# Patient Record
Sex: Female | Born: 1960 | Hispanic: Yes | Smoking: Never smoker
Health system: Southern US, Community
[De-identification: ages and names within clinical notes are randomized; demographics above are authoritative.]

## PROBLEM LIST (undated history)

## (undated) DIAGNOSIS — I10 Essential (primary) hypertension: Secondary | ICD-10-CM

## (undated) DIAGNOSIS — E785 Hyperlipidemia, unspecified: Secondary | ICD-10-CM

## (undated) HISTORY — PX: APPENDECTOMY: SHX54

## (undated) HISTORY — DX: Hyperlipidemia, unspecified: E78.5

## (undated) HISTORY — DX: Essential (primary) hypertension: I10

---

## 2015-01-03 ENCOUNTER — Encounter: Payer: Self-pay | Admitting: Family Medicine

## 2015-01-03 ENCOUNTER — Ambulatory Visit (INDEPENDENT_AMBULATORY_CARE_PROVIDER_SITE_OTHER): Payer: Self-pay | Admitting: Family Medicine

## 2015-01-03 VITALS — BP 160/90 | HR 79 | Ht 59.0 in | Wt 143.0 lb

## 2015-01-03 DIAGNOSIS — Z0000 Encounter for general adult medical examination without abnormal findings: Secondary | ICD-10-CM

## 2015-01-03 DIAGNOSIS — I10 Essential (primary) hypertension: Secondary | ICD-10-CM

## 2015-01-03 DIAGNOSIS — Z1239 Encounter for other screening for malignant neoplasm of breast: Secondary | ICD-10-CM

## 2015-01-03 DIAGNOSIS — R011 Cardiac murmur, unspecified: Secondary | ICD-10-CM

## 2015-01-03 DIAGNOSIS — Z23 Encounter for immunization: Secondary | ICD-10-CM

## 2015-01-03 DIAGNOSIS — R030 Elevated blood-pressure reading, without diagnosis of hypertension: Secondary | ICD-10-CM

## 2015-01-03 DIAGNOSIS — IMO0001 Reserved for inherently not codable concepts without codable children: Secondary | ICD-10-CM | POA: Insufficient documentation

## 2015-01-03 HISTORY — DX: Essential (primary) hypertension: I10

## 2015-01-03 LAB — CBC
HEMATOCRIT: 42 % (ref 36.0–46.0)
HEMOGLOBIN: 14.1 g/dL (ref 12.0–15.0)
MCH: 29.3 pg (ref 26.0–34.0)
MCHC: 33.6 g/dL (ref 30.0–36.0)
MCV: 87.3 fL (ref 78.0–100.0)
MPV: 9.1 fL (ref 8.6–12.4)
Platelets: 374 10*3/uL (ref 150–400)
RBC: 4.81 MIL/uL (ref 3.87–5.11)
RDW: 13.1 % (ref 11.5–15.5)
WBC: 4.3 10*3/uL (ref 4.0–10.5)

## 2015-01-03 NOTE — Assessment & Plan Note (Signed)
Recheck in a few weeks.

## 2015-01-03 NOTE — Progress Notes (Signed)
Michele Burch is a 54 y.o. female who presents to [name]  today for establish care. Patient denies any current medical problems. She feels well and is asymptomatic. She is here today to establish care and to get a form filled out for her work. She notes that in the past she's had an appendectomy, and some other abdominal surgery that she's not quite sure of. Additionally 2 years ago she was found to have a murmur and [place].  She states that everything was normal and has records at home that she will bring in. No fevers chills nausea vomiting or diarrhea.   Health maintenance: Patient is unsure she's never had any kind of colon cancer screening. She states her last mammogram was around 2 years ago and has been "a long time" since her last Pap smear. She received a flu vaccine last week and is happy to get a tetanus vaccine today.  No past medical history on file. No past surgical history on file. Social History  Substance Use Topics  . Smoking status: Not on file  . Smokeless tobacco: Not on file  . Alcohol Use: Not on file   family history is not on file.  ROS as above Medications: No current outpatient prescriptions on file.   No current facility-administered medications for this visit.   Allergies not on file   Exam:  BP 160/90 mmHg  Pulse 79  Ht  (1.499 m)  Wt 143 lb (64.864 kg)  BMI 28.87 kg/m2 Gen: Well NAD HEENT: EOMI,  MMM Lungs: Normal work of breathing. CTABL Heart: Slight systolic murmur nonradiating. Best heard at the right upper sternal border no MRG Abd: NABS, Soft. Nondistended, Nontender Exts: Brisk capillary refill, warm and well perfused.   No results found for this or any previous visit (from the past 24 hour(s)). No results found.  Patient was given a Tdap vaccine prior to discharge Please see individual assessment and plan sections.

## 2015-01-03 NOTE — Assessment & Plan Note (Signed)
Patient will bring medical records for review.

## 2015-01-03 NOTE — Patient Instructions (Signed)
Thank you for coming in today. Return in 2-4 weeks for blood pressure recheck.  The mammogram will call you.  Get labs today.  We will call you with results.   Hypertension Hypertension, commonly called high blood pressure, is when the force of blood pumping through your arteries is too strong. Your arteries are the blood vessels that carry blood from your heart throughout your body. A blood pressure reading consists of a higher number over a lower number, such as 110/72. The higher number (systolic) is the pressure inside your arteries when your heart pumps. The lower number (diastolic) is the pressure inside your arteries when your heart relaxes. Ideally you want your blood pressure below 120/80. Hypertension forces your heart to work harder to pump blood. Your arteries may become narrow or stiff. Having untreated or uncontrolled hypertension can cause heart attack, stroke, kidney disease, and other problems. RISK FACTORS Some risk factors for high blood pressure are controllable. Others are not.  Risk factors you cannot control include:   Race. You may be at higher risk if you are African American.  Age. Risk increases with age.  Gender. Men are at higher risk than women before age 9 years. After age 48, women are at higher risk than men. Risk factors you can control include:  Not getting enough exercise or physical activity.  Being overweight.  Getting too much fat, sugar, calories, or salt in your diet.  Drinking too much alcohol. SIGNS AND SYMPTOMS Hypertension does not usually cause signs or symptoms. Extremely high blood pressure (hypertensive crisis) may cause headache, anxiety, shortness of breath, and nosebleed. DIAGNOSIS To check if you have hypertension, your health care provider will measure your blood pressure while you are seated, with your arm held at the level of your heart. It should be measured at least twice using the same arm. Certain conditions can cause a  difference in blood pressure between your right and left arms. A blood pressure reading that is higher than normal on one occasion does not mean that you need treatment. If it is not clear whether you have high blood pressure, you may be asked to return on a different day to have your blood pressure checked again. Or, you may be asked to monitor your blood pressure at home for 1 or more weeks. TREATMENT Treating high blood pressure includes making lifestyle changes and possibly taking medicine. Living a healthy lifestyle can help lower high blood pressure. You may need to change some of your habits. Lifestyle changes may include:  Following the DASH diet. This diet is high in fruits, vegetables, and whole grains. It is low in salt, red meat, and added sugars.  Keep your sodium intake below 2,300 mg per day.  Getting at least 30-45 minutes of aerobic exercise at least 4 times per week.  Losing weight if necessary.  Not smoking.  Limiting alcoholic beverages.  Learning ways to reduce stress. Your health care provider may prescribe medicine if lifestyle changes are not enough to get your blood pressure under control, and if one of the following is true:  You are 44-25 years of age and your systolic blood pressure is above 140.  You are 39 years of age or older, and your systolic blood pressure is above 150.  Your diastolic blood pressure is above 90.  You have diabetes, and your systolic blood pressure is over 140 or your diastolic blood pressure is over 90.  You have kidney disease and your blood pressure is above  140/90.  You have heart disease and your blood pressure is above 140/90. Your personal target blood pressure may vary depending on your medical conditions, your age, and other factors. HOME CARE INSTRUCTIONS  Have your blood pressure rechecked as directed by your health care provider.   Take medicines only as directed by your health care provider. Follow the directions  carefully. Blood pressure medicines must be taken as prescribed. The medicine does not work as well when you skip doses. Skipping doses also puts you at risk for problems.  Do not smoke.   Monitor your blood pressure at home as directed by your health care provider. SEEK MEDICAL CARE IF:   You think you are having a reaction to medicines taken.  You have recurrent headaches or feel dizzy.  You have swelling in your ankles.  You have trouble with your vision. SEEK IMMEDIATE MEDICAL CARE IF:  You develop a severe headache or confusion.  You have unusual weakness, numbness, or feel faint.  You have severe chest or abdominal pain.  You vomit repeatedly.  You have trouble breathing. MAKE SURE YOU:   Understand these instructions.  Will watch your condition.  Will get help right away if you are not doing well or get worse.   This information is not intended to replace advice given to you by your health care provider. Make sure you discuss any questions you have with your health care provider.   Document Released: 03/12/2005 Document Revised: 07/27/2014 Document Reviewed: 01/02/2013 Elsevier Interactive Patient Education [employer]

## 2015-01-03 NOTE — Assessment & Plan Note (Addendum)
Doing reasonably well. Check metabolic panel lipid panel HIV hepatitis C CBC. Tdap given today. Patient declined Pap smear and colonoscopy study that she'll think about it. Mammogram ordered

## 2015-01-04 ENCOUNTER — Telehealth: Payer: Self-pay | Admitting: Family Medicine

## 2015-01-04 LAB — COMPREHENSIVE METABOLIC PANEL
ALBUMIN: 4.4 g/dL (ref 3.6–5.1)
ALT: 26 U/L (ref 6–29)
AST: 23 U/L (ref 10–35)
Alkaline Phosphatase: 88 U/L (ref 33–130)
BUN: 13 mg/dL (ref 7–25)
CALCIUM: 9.5 mg/dL (ref 8.6–10.4)
CHLORIDE: 106 mmol/L (ref 98–110)
CO2: 27 mmol/L (ref 20–31)
Creat: 0.51 mg/dL (ref 0.50–1.05)
Glucose, Bld: 92 mg/dL (ref 65–99)
Potassium: 4.3 mmol/L (ref 3.5–5.3)
SODIUM: 141 mmol/L (ref 135–146)
Total Bilirubin: 0.5 mg/dL (ref 0.2–1.2)
Total Protein: 7.1 g/dL (ref 6.1–8.1)

## 2015-01-04 LAB — HEPATITIS C ANTIBODY: HCV Ab: NEGATIVE

## 2015-01-04 LAB — TSH: TSH: 1.893 u[IU]/mL (ref 0.350–4.500)

## 2015-01-04 LAB — LIPID PANEL
Cholesterol: 250 mg/dL — ABNORMAL HIGH (ref 125–200)
HDL: 47 mg/dL (ref >=46)
LDL CALC: 178 mg/dL — AB (ref <130)
TRIGLYCERIDES: 127 mg/dL (ref <150)
Total CHOL/HDL Ratio: 5.3 Ratio — ABNORMAL HIGH (ref <=5.0)
VLDL: 25 mg/dL (ref <30)

## 2015-01-04 LAB — HIV ANTIBODY (ROUTINE TESTING W REFLEX): HIV 1&2 Ab, 4th Generation: NONREACTIVE

## 2015-01-04 MED ORDER — ATORVASTATIN CALCIUM 40 MG PO TABS: 40.0000 mg | ORAL_TABLET | Freq: Every day | ORAL | Status: DC

## 2015-01-04 NOTE — Addendum Note (Signed)
Addended by: Rodolph Bong on: 01/04/2015 08:11 AM   Modules accepted: Orders

## 2015-01-04 NOTE — Progress Notes (Signed)
Quick Note:  Labs are OK. Cholesterol is high. Plan to start lipitor. Return for recheck cholesterol in 1- 3 months. ______

## 2015-01-04 NOTE — Telephone Encounter (Signed)
Patients file had incorrect phone number listed in chart. We had been trying to contact to inform of test results. Incorrect phone number has been removed and a message for emergency contact has been left requesting an updated phone number.

## 2015-11-05 ENCOUNTER — Other Ambulatory Visit: Payer: Self-pay | Admitting: Family Medicine

## 2016-04-16 ENCOUNTER — Encounter: Payer: Managed Care, Other (non HMO) | Admitting: Family Medicine

## 2016-04-30 ENCOUNTER — Ambulatory Visit (INDEPENDENT_AMBULATORY_CARE_PROVIDER_SITE_OTHER): Payer: Managed Care, Other (non HMO) | Admitting: Family Medicine

## 2016-04-30 ENCOUNTER — Encounter: Payer: Self-pay | Admitting: Family Medicine

## 2016-04-30 VITALS — BP 163/85 | HR 78 | Ht 59.0 in | Wt 144.0 lb

## 2016-04-30 DIAGNOSIS — Z0000 Encounter for general adult medical examination without abnormal findings: Secondary | ICD-10-CM | POA: Diagnosis not present

## 2016-04-30 DIAGNOSIS — E782 Mixed hyperlipidemia: Secondary | ICD-10-CM | POA: Diagnosis not present

## 2016-04-30 DIAGNOSIS — Z1239 Encounter for other screening for malignant neoplasm of breast: Secondary | ICD-10-CM

## 2016-04-30 DIAGNOSIS — Z1231 Encounter for screening mammogram for malignant neoplasm of breast: Secondary | ICD-10-CM | POA: Diagnosis not present

## 2016-04-30 DIAGNOSIS — Z1211 Encounter for screening for malignant neoplasm of colon: Secondary | ICD-10-CM

## 2016-04-30 DIAGNOSIS — E785 Hyperlipidemia, unspecified: Secondary | ICD-10-CM | POA: Insufficient documentation

## 2016-04-30 DIAGNOSIS — I10 Essential (primary) hypertension: Secondary | ICD-10-CM | POA: Diagnosis not present

## 2016-04-30 HISTORY — DX: Hyperlipidemia, unspecified: E78.5

## 2016-04-30 LAB — LIPID PANEL
Cholesterol: 254 mg/dL — ABNORMAL HIGH (ref <200)
HDL: 54 mg/dL (ref >50)
LDL CALC: 174 mg/dL — AB (ref <100)
Total CHOL/HDL Ratio: 4.7 Ratio (ref <5.0)
Triglycerides: 131 mg/dL (ref <150)
VLDL: 26 mg/dL (ref <30)

## 2016-04-30 LAB — COMPLETE METABOLIC PANEL WITH GFR
ALK PHOS: 83 U/L (ref 33–130)
ALT: 15 U/L (ref 6–29)
AST: 19 U/L (ref 10–35)
Albumin: 4.3 g/dL (ref 3.6–5.1)
BUN: 9 mg/dL (ref 7–25)
CHLORIDE: 106 mmol/L (ref 98–110)
CO2: 27 mmol/L (ref 20–31)
Calcium: 9.4 mg/dL (ref 8.6–10.4)
Creat: 0.59 mg/dL (ref 0.50–1.05)
GFR, Est African American: >89 mL/min (ref >=60)
GLUCOSE: 96 mg/dL (ref 65–99)
POTASSIUM: 4 mmol/L (ref 3.5–5.3)
SODIUM: 142 mmol/L (ref 135–146)
Total Bilirubin: 0.6 mg/dL (ref 0.2–1.2)
Total Protein: 7.4 g/dL (ref 6.1–8.1)

## 2016-04-30 LAB — CBC
HCT: 43.6 % (ref 35.0–45.0)
Hemoglobin: 14.5 g/dL (ref 11.7–15.5)
MCH: 29.6 pg (ref 27.0–33.0)
MCHC: 33.3 g/dL (ref 32.0–36.0)
MCV: 89 fL (ref 80.0–100.0)
MPV: 9.1 fL (ref 7.5–12.5)
PLATELETS: 335 10*3/uL (ref 140–400)
RBC: 4.9 MIL/uL (ref 3.80–5.10)
RDW: 13 % (ref 11.0–15.0)
WBC: 4.1 10*3/uL (ref 3.8–10.8)

## 2016-04-30 MED ORDER — LISINOPRIL 10 MG PO TABS: 10.0000 mg | ORAL_TABLET | Freq: Every day | ORAL | 1 refills | Status: DC

## 2016-04-30 NOTE — Progress Notes (Signed)
       Michele Burch is a 56 y.o. female who presents to [facility] today for well adult visit. Patient has done well in the last year. She denies chest pain palpitations shortness of breath. She has tried eating a diet an effort to lower her cholesterol naturally. She did not start taking the Lipitor that was prescribed of the last visit. She does not measure her blood pressure regularly. She notes that she received [place] Pap smear in [place] a few years ago and has records at home. She thinks is been several years since her last mammogram and she has never had colon cancer screening.    No past medical history on file. Past Surgical History:  Procedure Laterality Date  . APPENDECTOMY     Social History  Substance Use Topics  . Smoking status: Never Smoker  . Smokeless tobacco: Never Used  . Alcohol use No   family history is not on file.  ROS as above:  Medications: Current Outpatient Prescriptions  Medication Sig Dispense Refill  . lisinopril (PRINIVIL,ZESTRIL) 10 MG tablet Take 1 tablet (10 mg total) by mouth daily. 30 tablet 1   No current facility-administered medications for this visit.    No Known Allergies  Health Maintenance Health Maintenance  Topic Date Due  . Fecal DNA (Cologuard)  07/10/2009  . MAMMOGRAM  01/03/2015  . PAP SMEAR  03/26/2017 (Originally 07/10/1980)  . TETANUS/TDAP  01/02/2025  . INFLUENZA VACCINE  Addressed  . Hepatitis C Screening  Completed  . HIV Screening  Completed     Exam:  BP (!) 163/85   Pulse 78   Ht 4\' 11"  (1.499 m)   Wt 144 lb (65.3 kg)   BMI 29.08 kg/m  Gen: Well NAD HEENT: EOMI,  MMM Lungs: Normal work of breathing. CTABL Heart: RRR no MRG Abd: NABS, Soft. Nondistended, Nontender Exts: Brisk capillary refill, warm and well perfused.    No results found for this or any previous visit (from the past  72 hour(s)). No results found.    Assessment and Plan: 56 y.o. female with  Well adult visit. Patient has uncontrolled hypertension which we will address today as well. Plan to start lisinopril. Plan to also check a sick fasting labs listed below.  We will address health maintenance items by ordering mammogram as well as Cologuard. We'll attempt to obtain records about previous Pap smears.  Recheck in about a month or so.  Biometric form pending lab results   Orders Placed This Encounter  Procedures  . MM DIGITAL SCREENING BILATERAL    Standing Status:   Future    Standing Expiration Date:   06/30/2017    Order Specific Question:   Is the patient pregnant?    Answer:   No    Order Specific Question:   Preferred imaging location?    [name]    Order Specific Question:   Reason for exam:    Answer:   screen breast cancer  . CBC  . COMPLETE METABOLIC PANEL WITH GFR  . Lipid panel    Discussed warning signs or symptoms. Please see discharge instructions. Patient expresses understanding.

## 2016-04-30 NOTE — Patient Instructions (Signed)
Thank you for coming in today. Go to the xray dept downstairs today and schedule mammogram.  You should get a kit in the mail for colon cancer screening.  Make sure to contact your insurance company about costs before you send the kit back.  Start lisinopril for blood pressure.   Recheck in 1 month.   Please bring medical records.   Lisinopril tablets What is this medicine? LISINOPRIL (lyse IN oh pril) is an ACE inhibitor. This medicine is used to treat high blood pressure and heart failure. It is also used to protect the heart immediately after a heart attack. This medicine may be used for other purposes; ask your health care provider or pharmacist if you have questions. COMMON BRAND NAME(S): Prinivil, Zestril What should I tell my health care provider before I take this medicine? They need to know if you have any of these conditions: -diabetes -heart or blood vessel disease -kidney disease -low blood pressure -previous swelling of the tongue, face, or lips with difficulty breathing, difficulty swallowing, hoarseness, or tightening of the throat -an unusual or allergic reaction to lisinopril, other ACE inhibitors, insect venom, foods, dyes, or preservatives -pregnant or trying to get pregnant -breast-feeding How should I use this medicine? Take this medicine by mouth with a glass of water. Follow the directions on your prescription label. You may take this medicine with or without food. If it upsets your stomach, take it with food. Take your medicine at regular intervals. Do not take it more often than directed. Do not stop taking except on your doctor's advice. Talk to your pediatrician regarding the use of this medicine in children. Special care may be needed. While this drug may be prescribed for children as young as 1 years of age for selected conditions, precautions do apply. Overdosage: If you think you have taken too much of this medicine contact a poison control center or  emergency room at once. NOTE: This medicine is only for you. Do not share this medicine with others. What if I miss a dose? If you miss a dose, take it as soon as you can. If it is almost time for your next dose, take only that dose. Do not take double or extra doses. What may interact with this medicine? Do not take this medicine with any of the following medications: -hymenoptera venom -sacubitril; valsartan This medicines may also interact with the following medications: -aliskiren -angiotensin receptor blockers, like losartan or valsartan -certain medicines for diabetes -diuretics -everolimus -gold compounds -lithium -NSAIDs, medicines for pain and inflammation, like ibuprofen or naproxen -potassium salts or supplements -salt substitutes -sirolimus -temsirolimus This list may not describe all possible interactions. Give your health care provider a list of all the medicines, herbs, non-prescription drugs, or dietary supplements you use. Also tell them if you smoke, drink alcohol, or use illegal drugs. Some items may interact with your medicine. What should I watch for while using this medicine? Visit your doctor or health care professional for regular check ups. Check your blood pressure as directed. Ask your doctor what your blood pressure should be, and when you should contact him or her. Do not treat yourself for coughs, colds, or pain while you are using this medicine without asking your doctor or health care professional for advice. Some ingredients may increase your blood pressure. Women should inform their doctor if they wish to become pregnant or think they might be pregnant. There is a potential for serious side effects to an unborn child. Talk to  your health care professional or pharmacist for more information. Check with your doctor or health care professional if you get an attack of severe diarrhea, nausea and vomiting, or if you sweat a lot. The loss of too much body fluid  can make it dangerous for you to take this medicine. You may get drowsy or dizzy. Do not drive, use machinery, or do anything that needs mental alertness until you know how this drug affects you. Do not stand or sit up quickly, especially if you are an older patient. This reduces the risk of dizzy or fainting spells. Alcohol can make you more drowsy and dizzy. Avoid alcoholic drinks. Avoid salt substitutes unless you are told otherwise by your doctor or health care professional. What side effects may I notice from receiving this medicine? Side effects that you should report to your doctor or health care professional as soon as possible: -allergic reactions like skin rash, itching or hives, swelling of the hands, feet, face, lips, throat, or tongue -breathing problems -signs and symptoms of kidney injury like trouble passing urine or change in the amount of urine -signs and symptoms of increased potassium like muscle weakness; chest pain; or fast, irregular heartbeat -signs and symptoms of liver injury like dark yellow or brown urine; general ill feeling or flu-like symptoms; light-colored stools; loss of appetite; nausea; right upper belly pain; unusually weak or tired; yellowing of the eyes or skin -signs and symptoms of low blood pressure like dizziness; feeling faint or lightheaded, falls; unusually weak or tired -stomach pain with or without nausea and vomiting Side effects that usually do not require medical attention (report to your doctor or health care professional if they continue or are bothersome): -changes in taste -cough -dizziness -fever -headache -sensitivity to light This list may not describe all possible side effects. Call your doctor for medical advice about side effects. You may report side effects to FDA at 1-800-FDA-1088. Where should I keep my medicine? Keep out of the reach of children. Store at room temperature between 15 and 30 degrees C (59 and 86 degrees F). Protect  from moisture. Keep container tightly closed. Throw away any unused medicine after the expiration date. NOTE: This sheet is a summary. It may not cover all possible information. If you have questions about this medicine, talk to your doctor, pharmacist, or health care provider.  2017 Elsevier/Gold Standard (2015-05-02 12:52:35)

## 2016-05-01 MED ORDER — ATORVASTATIN CALCIUM 20 MG PO TABS: 20.0000 mg | ORAL_TABLET | Freq: Every day | ORAL | 0 refills | Status: DC

## 2016-05-01 NOTE — Addendum Note (Signed)
Addended by: Rodolph BongOREY, Kimiye Strathman S on: 05/01/2016 07:03 AM   Modules accepted: Orders

## 2016-07-02 ENCOUNTER — Other Ambulatory Visit: Payer: Self-pay | Admitting: Family Medicine

## 2016-08-02 ENCOUNTER — Other Ambulatory Visit: Payer: Self-pay | Admitting: Family Medicine

## 2016-08-20 ENCOUNTER — Other Ambulatory Visit: Payer: Self-pay | Admitting: Family Medicine

## 2016-11-20 ENCOUNTER — Other Ambulatory Visit: Payer: Self-pay | Admitting: Family Medicine

## 2017-01-28 ENCOUNTER — Ambulatory Visit (INDEPENDENT_AMBULATORY_CARE_PROVIDER_SITE_OTHER): Payer: 59 | Admitting: Family Medicine

## 2017-01-28 ENCOUNTER — Encounter: Payer: Self-pay | Admitting: Family Medicine

## 2017-01-28 ENCOUNTER — Other Ambulatory Visit: Payer: Self-pay | Admitting: Family Medicine

## 2017-01-28 VITALS — BP 144/74 | HR 70 | Ht 59.02 in | Wt 143.0 lb

## 2017-01-28 DIAGNOSIS — E782 Mixed hyperlipidemia: Secondary | ICD-10-CM

## 2017-01-28 DIAGNOSIS — Z0000 Encounter for general adult medical examination without abnormal findings: Secondary | ICD-10-CM | POA: Diagnosis not present

## 2017-01-28 DIAGNOSIS — I10 Essential (primary) hypertension: Secondary | ICD-10-CM

## 2017-01-28 LAB — COMPLETE METABOLIC PANEL WITH GFR
AG Ratio: 1.4 (calc) (ref 1.0–2.5)
ALKALINE PHOSPHATASE (APISO): 91 U/L (ref 33–130)
ALT: 16 U/L (ref 6–29)
AST: 17 U/L (ref 10–35)
Albumin: 4.3 g/dL (ref 3.6–5.1)
BILIRUBIN TOTAL: 0.4 mg/dL (ref 0.2–1.2)
BUN: 13 mg/dL (ref 7–25)
CHLORIDE: 106 mmol/L (ref 98–110)
CO2: 28 mmol/L (ref 20–32)
Calcium: 9.5 mg/dL (ref 8.6–10.4)
Creat: 0.7 mg/dL (ref 0.50–1.05)
GFR, Est African American: 111 mL/min/{1.73_m2} (ref >=60)
GFR, Est Non African American: 96 mL/min/{1.73_m2} (ref >=60)
GLUCOSE: 95 mg/dL (ref 65–99)
Globulin: 3.1 g/dL (calc) (ref 1.9–3.7)
Potassium: 4.3 mmol/L (ref 3.5–5.3)
Sodium: 142 mmol/L (ref 135–146)
Total Protein: 7.4 g/dL (ref 6.1–8.1)

## 2017-01-28 LAB — LIPID PANEL W/REFLEX DIRECT LDL
CHOL/HDL RATIO: 3.1 (calc) (ref <5.0)
Cholesterol: 182 mg/dL (ref <200)
HDL: 59 mg/dL (ref >50)
LDL CHOLESTEROL (CALC): 106 mg/dL — AB
NON-HDL CHOLESTEROL (CALC): 123 mg/dL (ref <130)
TRIGLYCERIDES: 83 mg/dL (ref <150)

## 2017-01-28 LAB — CBC
HCT: 40.8 % (ref 35.0–45.0)
HEMOGLOBIN: 13.9 g/dL (ref 11.7–15.5)
MCH: 29.3 pg (ref 27.0–33.0)
MCHC: 34.1 g/dL (ref 32.0–36.0)
MCV: 86.1 fL (ref 80.0–100.0)
MPV: 9.4 fL (ref 7.5–12.5)
PLATELETS: 331 10*3/uL (ref 140–400)
RBC: 4.74 10*6/uL (ref 3.80–5.10)
RDW: 12.2 % (ref 11.0–15.0)
WBC: 4.4 10*3/uL (ref 3.8–10.8)

## 2017-01-28 MED ORDER — DICLOFENAC SODIUM 1 % TD GEL: 2.0000 g | Freq: Four times a day (QID) | TRANSDERMAL | 11 refills | Status: AC

## 2017-01-28 NOTE — Progress Notes (Signed)
       Michele Burch is a 56 y.o. female who [facility] today for well adult.  Is doing well overall. She is interested in Cologuard for colon cancer screening. She has a mammogram scheduled later this week. She would like to delay her smear for cervical cancer screening until later this year or early next year. She tries to get some opportunistic exercise at work but does not have any formal exercise during her day. She is trying to eat a low carbohydrate low calorie diet. She feels well otherwise with no fevers chills nausea vomiting or diarrhea.   Past Medical History:  Diagnosis Date  . HLD (hyperlipidemia) 04/30/2016  . HTN (hypertension) 01/03/2015   Past Surgical History:  Procedure Laterality Date  . APPENDECTOMY     Social History   Tobacco Use  . Smoking status: Never Smoker  . Smokeless tobacco: Never Used  Substance Use Topics  . Alcohol use: No    Alcohol/week: 0.0 oz   family history is not on file.  ROS as above:  Medications: Current Outpatient Medications  Medication Sig Dispense Refill  . atorvastatin (LIPITOR) 20 MG tablet TAKE 1 TABLET (20 MG TOTAL) BY MOUTH DAILY. 90 tablet 0  . lisinopril (PRINIVIL,ZESTRIL) 10 MG tablet Take 1 tablet (10 mg total) by mouth daily. 30 tablet 1  . diclofenac sodium (VOLTAREN) 1 % GEL Apply 2 g 4 (four) times daily topically. To affected joint. 100 g 11   No current facility-administered medications for this visit.    No Known Allergies  Health Maintenance Health Maintenance  Topic Date Due  . Fecal DNA (Cologuard)  07/10/2009  . MAMMOGRAM  01/03/2015  . PAP SMEAR  03/26/2017 (Originally 07/10/1980)  . INFLUENZA VACCINE  01/28/2018 (Originally 10/24/2016)  . TETANUS/TDAP  01/02/2025  . Hepatitis C Screening  Completed  . HIV Screening  Completed     Exam:  BP (!) 144/74   Pulse 70   Ht 4'  11.02" (1.499 m)   Wt 143 lb (64.9 kg)   BMI 28.87 kg/m   Wt Readings from Last 5 Encounters:  01/28/17 143 lb (64.9 kg)  04/30/16 144 lb (65.3 kg)  01/03/15 143 lb (64.9 kg)    Gen: Well NAD HEENT: EOMI,  MMM Lungs: Normal work of breathing. CTABL Heart: RRR no MRG Abd: NABS, Soft. Nondistended, Nontender Exts: Brisk capillary refill, warm and well perfused.    No results found for this or any previous visit (from the past 72 hour(s)). No results found.    Assessment and Plan: 56 y.o. female with Well adult doing reasonably well. Plan to check fasting labs in the near future. Will obtain breast cancer screening mammogram later this week. We'll send order for Cologuard soon and patient will reschedule for cervical cancer screening either with myself or one of my colleagues in the near future as well.  Influenza vaccine declined  Recheck as needed.   Orders Placed This Encounter  Procedures  . CBC  . COMPLETE METABOLIC PANEL WITH GFR  . Lipid Panel w/reflex Direct LDL   Meds ordered this encounter  Medications  . diclofenac sodium (VOLTAREN) 1 % GEL    Sig: Apply 2 g 4 (four) times daily topically. To affected joint.    Dispense:  100 g    Refill:  11     Discussed warning signs or symptoms. Please see discharge instructions. Patient expresses understanding.

## 2017-01-28 NOTE — Patient Instructions (Addendum)
Thank you for coming in today. Send in cologuard kit.  Get labs today.  Get mammogram Friday.  Return for a Pap smear at any time.  If you want a woman doctor to do it, Dr Alexander can do the pap smears as well. Continue to work on diet to keep the weight off.  Apply voltaren gel to the right thumb up to 4x daily.  If not better recheck and we will xray and consider an injection.     

## 2017-01-29 ENCOUNTER — Other Ambulatory Visit: Payer: Self-pay | Admitting: Family Medicine

## 2017-01-29 MED ORDER — LISINOPRIL 10 MG PO TABS: 10.0000 mg | ORAL_TABLET | Freq: Every day | ORAL | 1 refills | Status: DC

## 2017-01-29 MED ORDER — ATORVASTATIN CALCIUM 40 MG PO TABS: 40.0000 mg | ORAL_TABLET | Freq: Every day | ORAL | 1 refills | Status: DC

## 2017-01-31 ENCOUNTER — Telehealth: Payer: Self-pay | Admitting: Family Medicine

## 2017-01-31 NOTE — Telephone Encounter (Signed)
Pt called.  Pt said she dropped of a form from her job on Monday and would like to pick it up tomorrow.  Thank you.

## 2017-01-31 NOTE — Telephone Encounter (Signed)
Form completed except for PCP signature. Will place in box. Left VM advising Pt of status update.

## 2017-01-31 NOTE — Telephone Encounter (Signed)
Thanks Kelsi! I appreciate you!

## 2017-02-01 ENCOUNTER — Ambulatory Visit (INDEPENDENT_AMBULATORY_CARE_PROVIDER_SITE_OTHER): Payer: 59

## 2017-02-01 ENCOUNTER — Encounter (INDEPENDENT_AMBULATORY_CARE_PROVIDER_SITE_OTHER): Payer: Self-pay

## 2017-02-01 DIAGNOSIS — Z1239 Encounter for other screening for malignant neoplasm of breast: Secondary | ICD-10-CM

## 2017-02-01 DIAGNOSIS — Z1231 Encounter for screening mammogram for malignant neoplasm of breast: Secondary | ICD-10-CM | POA: Diagnosis not present

## 2017-02-01 NOTE — Telephone Encounter (Signed)
11/9 @ 1010am L/M for pt letting her know the form is up front for her to pick up. Put in an envelope and in the drawer.

## 2017-03-07 ENCOUNTER — Other Ambulatory Visit: Payer: Self-pay | Admitting: Family Medicine

## 2017-08-05 ENCOUNTER — Other Ambulatory Visit: Payer: Self-pay | Admitting: Family Medicine

## 2018-01-20 ENCOUNTER — Ambulatory Visit (INDEPENDENT_AMBULATORY_CARE_PROVIDER_SITE_OTHER): Payer: 59 | Admitting: Family Medicine

## 2018-01-20 ENCOUNTER — Encounter: Payer: Self-pay | Admitting: Family Medicine

## 2018-01-20 VITALS — BP 146/99 | HR 84 | Ht 59.0 in | Wt 144.0 lb

## 2018-01-20 DIAGNOSIS — I10 Essential (primary) hypertension: Secondary | ICD-10-CM

## 2018-01-20 DIAGNOSIS — Z0000 Encounter for general adult medical examination without abnormal findings: Secondary | ICD-10-CM

## 2018-01-20 DIAGNOSIS — Z1211 Encounter for screening for malignant neoplasm of colon: Secondary | ICD-10-CM | POA: Diagnosis not present

## 2018-01-20 DIAGNOSIS — Z1239 Encounter for other screening for malignant neoplasm of breast: Secondary | ICD-10-CM

## 2018-01-20 DIAGNOSIS — E782 Mixed hyperlipidemia: Secondary | ICD-10-CM | POA: Diagnosis not present

## 2018-01-20 LAB — CBC
HEMATOCRIT: 42 % (ref 35.0–45.0)
HEMOGLOBIN: 14 g/dL (ref 11.7–15.5)
MCH: 29.6 pg (ref 27.0–33.0)
MCHC: 33.3 g/dL (ref 32.0–36.0)
MCV: 88.8 fL (ref 80.0–100.0)
MPV: 9.2 fL (ref 7.5–12.5)
Platelets: 324 10*3/uL (ref 140–400)
RBC: 4.73 10*6/uL (ref 3.80–5.10)
RDW: 12.3 % (ref 11.0–15.0)
WBC: 3.8 10*3/uL (ref 3.8–10.8)

## 2018-01-20 LAB — COMPLETE METABOLIC PANEL WITH GFR
AG Ratio: 1.5 (calc) (ref 1.0–2.5)
ALBUMIN MSPROF: 4.3 g/dL (ref 3.6–5.1)
ALKALINE PHOSPHATASE (APISO): 90 U/L (ref 33–130)
ALT: 19 U/L (ref 6–29)
AST: 21 U/L (ref 10–35)
BUN: 17 mg/dL (ref 7–25)
CO2: 29 mmol/L (ref 20–32)
CREATININE: 0.65 mg/dL (ref 0.50–1.05)
Calcium: 9.4 mg/dL (ref 8.6–10.4)
Chloride: 104 mmol/L (ref 98–110)
GFR, EST AFRICAN AMERICAN: 113 mL/min/{1.73_m2} (ref >=60)
GFR, EST NON AFRICAN AMERICAN: 98 mL/min/{1.73_m2} (ref >=60)
GLOBULIN: 2.9 g/dL (ref 1.9–3.7)
Glucose, Bld: 97 mg/dL (ref 65–99)
Potassium: 4.2 mmol/L (ref 3.5–5.3)
SODIUM: 140 mmol/L (ref 135–146)
TOTAL PROTEIN: 7.2 g/dL (ref 6.1–8.1)
Total Bilirubin: 0.6 mg/dL (ref 0.2–1.2)

## 2018-01-20 LAB — LIPID PANEL W/REFLEX DIRECT LDL
CHOL/HDL RATIO: 3.6 (calc) (ref <5.0)
Cholesterol: 194 mg/dL (ref <200)
HDL: 54 mg/dL (ref >50)
LDL Cholesterol (Calc): 123 mg/dL (calc) — ABNORMAL HIGH
NON-HDL CHOLESTEROL (CALC): 140 mg/dL — AB (ref <130)
Triglycerides: 80 mg/dL (ref <150)

## 2018-01-20 NOTE — Patient Instructions (Addendum)
Thank you for coming in today. Return cologuard Schedule pap smear with me soon.  You can try to reduce carbohydrates in your diet as this will help to keep the weight down.   Mammogram [phone]

## 2018-01-20 NOTE — Progress Notes (Signed)
Michele Burch is a 57 y.o. female who presents to [facility] B[facility]: Primary Care Sports Medicine today for well adult visit.  Michele Burch is doing well.  She is trying to eat a healthy diet and exercise regularly.  She takes medications listed below and tolerates them well.  She is planning on returning her Cologuard tomorrow.  Additionally she is planning on scheduling her follow-up mammogram later next month.  She like to reschedule for Pap smear in the near future should like to avoid doing it today but is planning on getting that test done.  She is happy with how things are going.   ROS as above:  Past Medical History:  Diagnosis Date  . HLD (hyperlipidemia) 04/30/2016  . HTN (hypertension) 01/03/2015   Past Surgical History:  Procedure Laterality Date  . APPENDECTOMY     Social History   Tobacco Use  . Smoking status: Never Smoker  . Smokeless tobacco: Never Used  Substance Use Topics  . Alcohol use: No    Alcohol/week: 0.0 standard drinks   family history is not on file.  Medications: Current Outpatient Medications  Medication Sig Dispense Refill  . atorvastatin (LIPITOR) 20 MG tablet TAKE 1 TABLET (20 MG TOTAL) BY MOUTH DAILY. 90 tablet 0  . atorvastatin (LIPITOR) 40 MG tablet TAKE 1 TABLET (40 MG TOTAL) DAILY BY MOUTH. 90 tablet 1  . diclofenac sodium (VOLTAREN) 1 % GEL Apply 2 g 4 (four) times daily topically. To affected joint. 100 g 11  . lisinopril (PRINIVIL,ZESTRIL) 10 MG tablet TAKE 1 TABLET (10 MG TOTAL) DAILY BY MOUTH. 90 tablet 1   No current facility-administered medications for this visit.    No Known Allergies  Health Maintenance Health Maintenance  Topic Date Due  . PAP SMEAR  07/10/1980  . Fecal DNA (Cologuard)  07/10/2009  . MAMMOGRAM  02/02/2019  . TETANUS/TDAP  01/02/2025  . INFLUENZA VACCINE  Completed  . Hepatitis C Screening  Completed  . HIV Screening   Completed     Exam:  BP (!) 146/99   Pulse 84   Ht 4\' 11"  (1.499 m)   Wt 144 lb (65.3 kg)   BMI 29.08 kg/m  Wt Readings from Last 5 Encounters:  01/20/18 144 lb (65.3 kg)  01/28/17 143 lb (64.9 kg)  04/30/16 144 lb (65.3 kg)  01/03/15 143 lb (64.9 kg)      Gen: Well NAD HEENT: EOMI,  MMM Lungs: Normal work of breathing. CTABL Heart: RRR no MRG Abd: NABS, Soft. Nondistended, Nontender Exts: Brisk capillary refill, warm and well perfused.  Psych: Alert and oriented normal speech thought process and affect.  Depression screen [facility] 2/9 01/20/2018 01/28/2017  Decreased Interest 0 0  Down, Depressed, Hopeless 0 0  PHQ - 2 Score 0 0  Altered sleeping 0 -  Tired, decreased energy 0 -  Change in appetite 0 -  Feeling bad or failure about yourself  0 -  Trouble concentrating 0 -  Moving slowly or fidgety/restless 0 -  Suicidal thoughts 0 -  PHQ-9 Score 0 -  Difficult doing work/chores Not difficult at all -      Assessment and Plan: 57 y.o. female with  Well adult.  Doing reasonably well.  Blood pressure minimally elevated today plan on rechecking in the near future. Scheduled for mammogram and reschedule for Pap smear however patient is push this off a few years now.  Check basic fasting labs continue current medications and  recheck in the near future for Pap smear and recheck blood pressure.    Orders Placed This Encounter  Procedures  . Cologuard   No orders of the defined types were placed in this encounter.    Discussed warning signs or symptoms. Please see discharge instructions. Patient expresses understanding.

## 2018-02-04 ENCOUNTER — Telehealth: Payer: Self-pay | Admitting: Family Medicine

## 2018-02-04 NOTE — Telephone Encounter (Signed)
Received fax from Cologuard patient had expired Cologuard order.  Will contact patient and see if she would like us to reorder Cologuard.  Additionally she is due for cervical cancer screening Pap smear.  Which she like a referral to OB/GYN or schedule with me in the near future for Pap smear?  If she like to avoid Pap smear let us know so we can cancel it.

## 2018-02-05 NOTE — Telephone Encounter (Signed)
Left pt msg to call back to discuss 

## 2018-02-12 NOTE — Telephone Encounter (Signed)
Patient declined at this time. Michele Burch,CMA

## 2018-02-16 ENCOUNTER — Other Ambulatory Visit: Payer: Self-pay | Admitting: Family Medicine

## 2018-04-08 ENCOUNTER — Telehealth: Payer: Self-pay | Admitting: Family Medicine

## 2018-04-08 NOTE — Telephone Encounter (Signed)
You are due for Pap smear.  We schedule an appointment with myself or 1 of my providers to get that test done.  You can schedule with Dr. Lyn Hollingshead as she will be happy to do a Pap smear as well.

## 2018-04-08 NOTE — Telephone Encounter (Signed)
Left message for a return call

## 2018-10-05 ENCOUNTER — Other Ambulatory Visit: Payer: Self-pay | Admitting: Family Medicine

## 2018-11-03 ENCOUNTER — Ambulatory Visit: Payer: 59 | Admitting: Family Medicine

## 2018-11-11 ENCOUNTER — Other Ambulatory Visit: Payer: Self-pay | Admitting: Family Medicine

## 2018-11-11 DIAGNOSIS — Z1231 Encounter for screening mammogram for malignant neoplasm of breast: Secondary | ICD-10-CM

## 2018-12-10 ENCOUNTER — Other Ambulatory Visit: Payer: Self-pay

## 2018-12-10 ENCOUNTER — Ambulatory Visit (INDEPENDENT_AMBULATORY_CARE_PROVIDER_SITE_OTHER): Payer: 59

## 2018-12-10 DIAGNOSIS — Z1231 Encounter for screening mammogram for malignant neoplasm of breast: Secondary | ICD-10-CM

## 2019-01-19 ENCOUNTER — Encounter: Payer: Self-pay | Admitting: Family Medicine

## 2019-01-19 ENCOUNTER — Other Ambulatory Visit: Payer: Self-pay

## 2019-01-19 ENCOUNTER — Ambulatory Visit (INDEPENDENT_AMBULATORY_CARE_PROVIDER_SITE_OTHER): Payer: 59 | Admitting: Family Medicine

## 2019-01-19 VITALS — BP 168/90 | HR 73 | Temp 98.4°F | Wt 146.0 lb

## 2019-01-19 DIAGNOSIS — Z0000 Encounter for general adult medical examination without abnormal findings: Secondary | ICD-10-CM | POA: Diagnosis not present

## 2019-01-19 DIAGNOSIS — Z6829 Body mass index (BMI) 29.0-29.9, adult: Secondary | ICD-10-CM | POA: Diagnosis not present

## 2019-01-19 DIAGNOSIS — E782 Mixed hyperlipidemia: Secondary | ICD-10-CM

## 2019-01-19 DIAGNOSIS — I10 Essential (primary) hypertension: Secondary | ICD-10-CM

## 2019-01-19 NOTE — Patient Instructions (Addendum)
Thank you for coming in today. Log your calories and try to get 1200 calories per day or less.  Myfitness pal is a good application to keep track of calories.   Return in 1-2 months with Dr Alexander for pap smear.   Return the cologuard kit.   Get labs today.   Cologuard is covered by Medicare and most major insurers. INSURANCE Cologuard is covered by Medicare and Medicare Advantage with no co-pay or deductible for eligible patients.  More than 92%* of all Cologuard patients have no out of-pocket cost for screening.  Based on the Affordable Care Act, Cologuard should be covered by most private insurers with no co-pay or deductible for eligible patients (ages 50-75; at average risk for colon cancer; without symptoms). Some exceptions may apply, so we recommend patients call their insurer to confirm.  BILL[facility] Call Cologuard at [phone] and speak with a member of our Patient Support Team to make sure the test will be covered.      I will be moving to full time Sports Medicine in [place] starting on November 2nd  You will still be able to see me for your Sports Medicine or Orthopedic needs at [facility] in [place]. I will still be part of [facility].    If you want to stay locally for your Sports Medicine issues Dr. Thekkekandam here in [place] will be happy to see you.  Additionally Dr. Jeremy Schmitz at [facility] [place] will be happy to see you for sports medicine issues more locally.   For your primary care needs you are welcome to establish care with Dr. Natalie Alexander.  Dr Cody Matthews (Starting in February) will be starting in the new year and a new NP Joy (Starting in December).  We are working quickly to hire more physicians to cover the primary care needs however if you cannot get an appointment with Dr. Alexander in a timely manner [place] has locations and openings for primary care services nearby.   [facility]  [facility] [place] [address] . [place] , [place] Main Line: [phone] . Behavioral Medicine: [phone] . Fax: .[phone]  [facility] [address] . [place], [place] Main Line: [phone] . Behavioral Medicine: [phone] . Fax: [phone] . Hours (M-F): 7am - 5pm  [facility] [place] [address] . [place], [place] Main Line: [phone] . Behavioral Medicine: [phone] . Fax: [phone] . Hours (M-F): 8am - 5pm    [facility] [address] . [place], [place] Phone: [phone] . Behavioral Medicine: [phone] . Fax: [phone]  

## 2019-01-19 NOTE — Progress Notes (Signed)
Michele Burch is a 58 y.o. female who presents to [facility] today for well adult visit.   Michele Burch has been having difficulty losing weight.  She does not eat much food but has trouble losing weight.  She does not exercise much but does work a physically active job.  Additionally she has hypertension.  She takes lisinopril 10 mg daily.  She notes when she checks her blood pressure at home it usually 564P or less systolic.  She was at the dentist recently and her blood pressure was 123/79.  She has hyperlipidemia which she tolerates atorvastatin for quite well.  She like to quit the medication she could.  Additionally she notes that she is due for cervical cancer screening.  She would like to proceed with Pap smear with a female provider and would like to schedule with possibly Dr. Sheppard Coil.  Additionally she is due for colon cancer screening.  Last year we agreed with Cologuard but she never returned the kit.  She notes that she just forgot to do it.    ROS as above:  Past Medical History:  Diagnosis Date  . HLD (hyperlipidemia) 04/30/2016  . HTN (hypertension) 01/03/2015   Past Surgical History:  Procedure Laterality Date  . APPENDECTOMY     Social History   Tobacco Use  . Smoking status: Never Smoker  . Smokeless tobacco: Never Used  Substance Use Topics  . Alcohol use: No    Alcohol/week: 0.0 standard drinks   family history is not on file.  Medications: Current Outpatient Medications  Medication Sig Dispense Refill  . atorvastatin (LIPITOR) 40 MG tablet TAKE 1 TABLET (40 MG TOTAL) DAILY BY MOUTH. 90 tablet 1  . diclofenac sodium (VOLTAREN) 1 % GEL Apply 2 g 4 (four) times daily topically. To affected joint. 100 g 11  . lisinopril (ZESTRIL) 10 MG tablet TAKE 1 TABLET (10 MG TOTAL) DAILY BY MOUTH. 90 tablet 1   No current facility-administered  medications for this visit.    No Known Allergies  Health Maintenance Health Maintenance  Topic Date Due  . PAP SMEAR-Modifier  07/10/1980  . Fecal DNA (Cologuard)  02/05/2019 (Originally 07/10/2009)  . MAMMOGRAM  12/09/2020  . TETANUS/TDAP  01/02/2025  . INFLUENZA VACCINE  Completed  . Hepatitis C Screening  Completed  . HIV Screening  Completed     Exam:  BP (!) 168/90   Pulse 73   Temp 98.4 F (36.9 C) (Oral)   Wt 146 lb (66.2 kg)   BMI 29.49 kg/m  Wt Readings from Last 5 Encounters:  01/19/19 146 lb (66.2 kg)  01/20/18 144 lb (65.3 kg)  01/28/17 143 lb (64.9 kg)  04/30/16 144 lb (65.3 kg)  01/03/15 143 lb (64.9 kg)      Gen: Well NAD HEENT: EOMI,  MMM Lungs: Normal work of breathing. CTABL Heart: RRR no MRG Abd: NABS, Soft. Nondistended, Nontender Exts: Brisk capillary refill, warm and well perfused.  Psych: Alert and oriented normal speech thought process and affect.  Depression screen [facility] 2/9 01/19/2019 01/20/2018 01/28/2017  Decreased Interest 0 0 0  Down, Depressed, Hopeless 0 0 0  PHQ - 2 Score 0 0 0  Altered sleeping - 0 -  Tired, decreased energy - 0 -  Change in appetite - 0 -  Feeling bad or failure about yourself  - 0 -  Trouble concentrating - 0 -  Moving slowly or fidgety/restless - 0 -  Suicidal thoughts - 0 -  PHQ-9 Score - 0 -  Difficult doing work/chores - Not difficult at all -       Lab and Radiology Results No results found for this or any previous visit (from the past 72 hour(s)). No results found.    Assessment and Plan: 58 y.o. female with  Well adult.  Doing reasonably well.  Discussed weight management strategies including reduced calorie diet.  Additionally will continue home blood pressure monitoring.  Cologuard ordered patient will return Cologuard in the near future.  Reschedule with Dr. Sheppard Coil near future for Pap smear and blood pressure recheck.  PDMP not reviewed this encounter. Orders Placed This  Encounter  Procedures  . CBC  . COMPLETE METABOLIC PANEL WITH GFR  . Lipid Panel w/reflex Direct LDL   No orders of the defined types were placed in this encounter.    Discussed warning signs or symptoms. Please see discharge instructions. Patient expresses understanding.

## 2019-01-20 LAB — CBC
HCT: 43 % (ref 35.0–45.0)
Hemoglobin: 14.3 g/dL (ref 11.7–15.5)
MCH: 29.3 pg (ref 27.0–33.0)
MCHC: 33.3 g/dL (ref 32.0–36.0)
MCV: 88.1 fL (ref 80.0–100.0)
MPV: 9.4 fL (ref 7.5–12.5)
Platelets: 333 10*3/uL (ref 140–400)
RBC: 4.88 10*6/uL (ref 3.80–5.10)
RDW: 12.2 % (ref 11.0–15.0)
WBC: 4.1 10*3/uL (ref 3.8–10.8)

## 2019-01-20 LAB — COMPLETE METABOLIC PANEL WITH GFR
AG Ratio: 1.4 (calc) (ref 1.0–2.5)
ALT: 14 U/L (ref 6–29)
AST: 17 U/L (ref 10–35)
Albumin: 4.2 g/dL (ref 3.6–5.1)
Alkaline phosphatase (APISO): 79 U/L (ref 37–153)
BUN: 12 mg/dL (ref 7–25)
CO2: 27 mmol/L (ref 20–32)
Calcium: 8.9 mg/dL (ref 8.6–10.4)
Chloride: 107 mmol/L (ref 98–110)
Creat: 0.62 mg/dL (ref 0.50–1.05)
GFR, Est African American: 114 mL/min/{1.73_m2} (ref >=60)
GFR, Est Non African American: 99 mL/min/{1.73_m2} (ref >=60)
Globulin: 2.9 g/dL (calc) (ref 1.9–3.7)
Glucose, Bld: 93 mg/dL (ref 65–99)
Potassium: 4.3 mmol/L (ref 3.5–5.3)
Sodium: 143 mmol/L (ref 135–146)
Total Bilirubin: 0.5 mg/dL (ref 0.2–1.2)
Total Protein: 7.1 g/dL (ref 6.1–8.1)

## 2019-01-20 LAB — LIPID PANEL W/REFLEX DIRECT LDL
Cholesterol: 246 mg/dL — ABNORMAL HIGH (ref <200)
HDL: 51 mg/dL (ref >=50)
LDL Cholesterol (Calc): 170 mg/dL (calc) — ABNORMAL HIGH
Non-HDL Cholesterol (Calc): 195 mg/dL (calc) — ABNORMAL HIGH (ref <130)
Total CHOL/HDL Ratio: 4.8 (calc) (ref <5.0)
Triglycerides: 122 mg/dL (ref <150)

## 2019-01-23 ENCOUNTER — Telehealth: Payer: Self-pay

## 2019-01-23 MED ORDER — ROSUVASTATIN CALCIUM 40 MG PO TABS: 40.0000 mg | ORAL_TABLET | Freq: Every day | ORAL | 3 refills | Status: DC

## 2019-01-23 NOTE — Telephone Encounter (Signed)
Michele Burch called and she was given her results of the last labs. She states she is taking the cholesterol medication everyday.

## 2019-01-23 NOTE — Telephone Encounter (Signed)
Will replace atorvastatin with rosuvastatin which is a bit more potent.  Plan on rechecking labs in about 3 months.

## 2019-01-23 NOTE — Telephone Encounter (Signed)
Left a message advising recommendations.

## 2019-01-28 NOTE — Telephone Encounter (Signed)
Left message advising of recommendations.  

## 2019-02-06 LAB — COLOGUARD: Cologuard: NEGATIVE

## 2019-02-25 ENCOUNTER — Encounter: Payer: Self-pay | Admitting: Sports Medicine

## 2019-03-11 ENCOUNTER — Encounter: Payer: Self-pay | Admitting: Family Medicine

## 2019-04-18 ENCOUNTER — Other Ambulatory Visit: Payer: Self-pay | Admitting: Family Medicine

## 2019-07-20 ENCOUNTER — Other Ambulatory Visit: Payer: Self-pay | Admitting: Physician Assistant

## 2019-07-20 NOTE — Telephone Encounter (Signed)
Needs to establish with new PCP

## 2019-07-23 ENCOUNTER — Other Ambulatory Visit: Payer: Self-pay | Admitting: Family Medicine

## 2019-07-23 MED ORDER — LISINOPRIL 10 MG PO TABS: 10.0000 mg | ORAL_TABLET | Freq: Every day | ORAL | 0 refills | Status: DC

## 2019-07-23 NOTE — Telephone Encounter (Signed)
30 day refill sent, patient must have appt for further refills. Due for pap smear.

## 2019-08-09 ENCOUNTER — Other Ambulatory Visit: Payer: Self-pay

## 2019-08-10 MED ORDER — LISINOPRIL 10 MG PO TABS: 10.0000 mg | ORAL_TABLET | Freq: Every day | ORAL | 0 refills | Status: DC

## 2019-09-21 ENCOUNTER — Other Ambulatory Visit: Payer: Self-pay | Admitting: Sports Medicine

## 2019-10-28 ENCOUNTER — Other Ambulatory Visit: Payer: Self-pay | Admitting: Osteopathic Medicine

## 2019-12-09 ENCOUNTER — Other Ambulatory Visit: Payer: Self-pay | Admitting: Osteopathic Medicine

## 2019-12-09 ENCOUNTER — Other Ambulatory Visit: Payer: Self-pay | Admitting: Family Medicine

## 2019-12-09 DIAGNOSIS — Z1231 Encounter for screening mammogram for malignant neoplasm of breast: Secondary | ICD-10-CM

## 2019-12-16 ENCOUNTER — Ambulatory Visit: Payer: 59

## 2020-02-06 ENCOUNTER — Other Ambulatory Visit: Payer: Self-pay | Admitting: Osteopathic Medicine

## 2020-05-16 ENCOUNTER — Encounter: Payer: 59 | Admitting: Family Medicine

## 2020-07-11 ENCOUNTER — Ambulatory Visit (INDEPENDENT_AMBULATORY_CARE_PROVIDER_SITE_OTHER): Payer: 59 | Admitting: Family Medicine

## 2020-07-11 ENCOUNTER — Other Ambulatory Visit: Payer: Self-pay

## 2020-07-11 ENCOUNTER — Encounter: Payer: Self-pay | Admitting: Family Medicine

## 2020-07-11 VITALS — BP 184/84 | HR 69 | Temp 97.8°F | Ht 59.0 in | Wt 147.6 lb

## 2020-07-11 DIAGNOSIS — E782 Mixed hyperlipidemia: Secondary | ICD-10-CM | POA: Diagnosis not present

## 2020-07-11 DIAGNOSIS — I10 Essential (primary) hypertension: Secondary | ICD-10-CM | POA: Diagnosis not present

## 2020-07-11 DIAGNOSIS — Z0000 Encounter for general adult medical examination without abnormal findings: Secondary | ICD-10-CM | POA: Insufficient documentation

## 2020-07-11 DIAGNOSIS — Z23 Encounter for immunization: Secondary | ICD-10-CM | POA: Diagnosis not present

## 2020-07-11 DIAGNOSIS — Z124 Encounter for screening for malignant neoplasm of cervix: Secondary | ICD-10-CM

## 2020-07-11 LAB — CBC WITH DIFFERENTIAL/PLATELET
Absolute Monocytes: 277 cells/uL (ref 200–950)
Basophils Absolute: 48 cells/uL (ref 0–200)
Basophils Relative: 1.1 %
Eosinophils Absolute: 101 cells/uL (ref 15–500)
Eosinophils Relative: 2.3 %
HCT: 46.6 % — ABNORMAL HIGH (ref 35.0–45.0)
Hemoglobin: 15.2 g/dL (ref 11.7–15.5)
Lymphs Abs: 1624 cells/uL (ref 850–3900)
MCH: 28.7 pg (ref 27.0–33.0)
MCHC: 32.6 g/dL (ref 32.0–36.0)
MCV: 88.1 fL (ref 80.0–100.0)
MPV: 9.5 fL (ref 7.5–12.5)
Monocytes Relative: 6.3 %
Neutro Abs: 2350 cells/uL (ref 1500–7800)
Neutrophils Relative %: 53.4 %
Platelets: 339 10*3/uL (ref 140–400)
RBC: 5.29 10*6/uL — ABNORMAL HIGH (ref 3.80–5.10)
RDW: 12.2 % (ref 11.0–15.0)
Total Lymphocyte: 36.9 %
WBC: 4.4 10*3/uL (ref 3.8–10.8)

## 2020-07-11 LAB — COMPLETE METABOLIC PANEL WITH GFR
AG Ratio: 1.5 (calc) (ref 1.0–2.5)
ALT: 16 U/L (ref 6–29)
AST: 18 U/L (ref 10–35)
Albumin: 4.6 g/dL (ref 3.6–5.1)
Alkaline phosphatase (APISO): 87 U/L (ref 37–153)
BUN: 11 mg/dL (ref 7–25)
CO2: 28 mmol/L (ref 20–32)
Calcium: 9.2 mg/dL (ref 8.6–10.4)
Chloride: 107 mmol/L (ref 98–110)
Creat: 0.62 mg/dL (ref 0.50–0.99)
GFR, Est African American: 113 mL/min/{1.73_m2} (ref >=60)
GFR, Est Non African American: 97 mL/min/{1.73_m2} (ref >=60)
Globulin: 3.1 g/dL (calc) (ref 1.9–3.7)
Glucose, Bld: 94 mg/dL (ref 65–99)
Potassium: 4.4 mmol/L (ref 3.5–5.3)
Sodium: 142 mmol/L (ref 135–146)
Total Bilirubin: 0.6 mg/dL (ref 0.2–1.2)
Total Protein: 7.7 g/dL (ref 6.1–8.1)

## 2020-07-11 LAB — LIPID PANEL W/REFLEX DIRECT LDL
Cholesterol: 278 mg/dL — ABNORMAL HIGH (ref <200)
HDL: 54 mg/dL (ref >=50)
LDL Cholesterol (Calc): 187 mg/dL (calc) — ABNORMAL HIGH
Non-HDL Cholesterol (Calc): 224 mg/dL (calc) — ABNORMAL HIGH (ref <130)
Total CHOL/HDL Ratio: 5.1 (calc) — ABNORMAL HIGH (ref <5.0)
Triglycerides: 194 mg/dL — ABNORMAL HIGH (ref <150)

## 2020-07-11 LAB — TSH: TSH: 2.74 mIU/L (ref 0.40–4.50)

## 2020-07-11 MED ORDER — LISINOPRIL 10 MG PO TABS: ORAL_TABLET | ORAL | 1 refills | Status: DC

## 2020-07-11 NOTE — Assessment & Plan Note (Signed)
Taking rosuvastatin previously, update lipid and CMP panels today.

## 2020-07-11 NOTE — Assessment & Plan Note (Signed)
Well adult Orders Placed This Encounter  Procedures  . Varicella-zoster vaccine IM (Shingrix)  . COMPLETE METABOLIC PANEL WITH GFR  . CBC with Differential  . TSH  . Lipid Panel w/reflex Direct LDL  . Ambulatory referral to Obstetrics / Gynecology    Referral Priority:   Routine    Referral Type:   Consultation    Referral Reason:   Specialty Services Required    Requested Specialty:   Obstetrics and Gynecology    Number of Visits Requested:   1  Screening: GYN referral to cervical cancer screening.   Immunizations: Shingrix #1 Anticipatory guidance/Risk factor reduction:  Restart BP medication.  Low sodium diet recommended. Additional recommendations per AVS.

## 2020-07-11 NOTE — Progress Notes (Signed)
Michele Burch - 60 y.o. female MRN [id number]  Date of birth: September 05, 1960  Subjective No chief complaint on file.   HPI Michele Burch is 60 y.o. female here today for annual exam.  This is her first visit with me.  She has a history of HTN and HLD.    She was previously taking lisinopril for HTN.  She has been out of this for several months.  She denies symptoms related to HTN including chest pain, shortness of breath, palpitations, headache or vision changes.     Previously taking crestor for HLD.  Has not taken in several months either.   She tolerated this well previously.   She is a non-smoker.  She denies EtOH use.  She walks for exercise.    She is UTD on colon cancer screening.  She needs updated PAP requests GYN referral.   She would like to have shingles vaccine    Review of Systems  Constitutional: Negative for chills, fever, malaise/fatigue and weight loss.  HENT: Negative for congestion, ear pain and sore throat.   Eyes: Negative for blurred vision, double vision and pain.  Respiratory: Negative for cough and shortness of breath.   Cardiovascular: Negative for chest pain and palpitations.  Gastrointestinal: Negative for abdominal pain, blood in stool, constipation, heartburn and nausea.  Genitourinary: Negative for dysuria and urgency.  Musculoskeletal: Negative for joint pain and myalgias.  Neurological: Negative for dizziness and headaches.  Endo/Heme/Allergies: Does not bruise/bleed easily.  Psychiatric/Behavioral: Negative for depression. The patient is not nervous/anxious and does not have insomnia.     No Known Allergies  Past Medical History:  Diagnosis Date  . HLD (hyperlipidemia) 04/30/2016  . HTN (hypertension) 01/03/2015    Past Surgical History:  Procedure Laterality Date  . APPENDECTOMY      Social History   Socioeconomic History  . Marital status: Unknown    Spouse name: Not on file  . Number of children: Not on file  . Years of education:  Not on file  . Highest education level: Not on file  Occupational History  . Not on file  Tobacco Use  . Smoking status: Never Smoker  . Smokeless tobacco: Never Used  Substance and Sexual Activity  . Alcohol use: No    Alcohol/week: 0.0 standard drinks  . Drug use: No  . Sexual activity: Never  Other Topics Concern  . Not on file  Social History Narrative  . Not on file   Social Determinants of Health   Financial Resource Strain: Not on file  Food Insecurity: Not on file  Transportation Needs: Not on file  Physical Activity: Not on file  Stress: Not on file  Social Connections: Not on file    No family history on file.  Health Maintenance  Topic Date Due  . PAP SMEAR-Modifier  Never done  . INFLUENZA VACCINE  10/24/2020  . MAMMOGRAM  12/09/2020  . Fecal DNA (Cologuard)  02/05/2022  . TETANUS/TDAP  01/02/2025  . Hepatitis C Screening  Completed  . HIV Screening  Completed  . HPV VACCINES  Aged Out     ----------------------------------------------------------------------------------------------------------------------------------------------------------------------------------------------------------------- Physical Exam There were no vitals taken for this visit.  Physical Exam Constitutional:      General: She is not in acute distress. HENT:     Head: Normocephalic and atraumatic.     Right Ear: Tympanic membrane normal.     Left Ear: Tympanic membrane normal.     Nose: Nose normal.  Eyes:  General: No scleral icterus.    Conjunctiva/sclera: Conjunctivae normal.  Neck:     Thyroid: No thyromegaly.  Cardiovascular:     Rate and Rhythm: Normal rate and regular rhythm.     Heart sounds: Normal heart sounds.  Pulmonary:     Effort: Pulmonary effort is normal.     Breath sounds: Normal breath sounds.  Abdominal:     General: Bowel sounds are normal. There is no distension.     Palpations: Abdomen is soft.     Tenderness: There is no abdominal  tenderness. There is no guarding.  Musculoskeletal:        General: Normal range of motion.     Cervical back: Normal range of motion and neck supple.  Lymphadenopathy:     Cervical: No cervical adenopathy.  Skin:    General: Skin is warm and dry.     Findings: No rash.  Neurological:     Mental Status: She is alert and oriented to person, place, and time.     Cranial Nerves: No cranial nerve deficit.     Coordination: Coordination normal.  Psychiatric:        Mood and Affect: Mood normal.        Behavior: Behavior normal.     ------------------------------------------------------------------------------------------------------------------------------------------------------------------------------------------------------------------- Assessment and Plan  HTN (hypertension) BP elevated today.  Restart lisinopril 10mg  daily with follow up in 4 weeks    HLD (hyperlipidemia) Taking rosuvastatin previously, update lipid and CMP panels today.   Well adult exam Well adult Orders Placed This Encounter  Procedures  . Varicella-zoster vaccine IM (Shingrix)  . COMPLETE METABOLIC PANEL WITH GFR  . CBC with Differential  . TSH  . Lipid Panel w/reflex Direct LDL  . Ambulatory referral to Obstetrics / Gynecology    Referral Priority:   Routine    Referral Type:   Consultation    Referral Reason:   Specialty Services Required    Requested Specialty:   Obstetrics and Gynecology    Number of Visits Requested:   1  Screening: GYN referral to cervical cancer screening.   Immunizations: Shingrix #1 Anticipatory guidance/Risk factor reduction:  Restart BP medication.  Low sodium diet recommended. Additional recommendations per AVS.     No orders of the defined types were placed in this encounter.   No follow-ups on file.    This visit occurred during the SARS-CoV-2 public health emergency.  Safety protocols were in place, including screening questions prior to the visit, additional  usage of staff PPE, and extensive cleaning of exam room while observing appropriate contact time as indicated for disinfecting solutions.

## 2020-07-11 NOTE — Patient Instructions (Signed)
Preventive Care 61-61 Years Old, Female Preventive care refers to lifestyle choices and visits with your health care provider that can promote health and wellness. This includes:  A yearly physical exam. This is also called an annual wellness visit.  Regular dental and eye exams.  Immunizations.  Screening for certain conditions.  Healthy lifestyle choices, such as: ? Eating a healthy diet. ? Getting regular exercise. ? Not using drugs or products that contain nicotine and tobacco. ? Limiting alcohol use. What can I expect for my preventive care visit? Physical exam Your health care provider will check your:  Height and weight. These may be used to calculate your BMI (body mass index). BMI is a measurement that tells if you are at a healthy weight.  Heart rate and blood pressure.  Body temperature.  Skin for abnormal spots. Counseling Your health care provider may ask you questions about your:  Past medical problems.  Family's medical history.  Alcohol, tobacco, and drug use.  Emotional well-being.  Home life and relationship well-being.  Sexual activity.  Diet, exercise, and sleep habits.  Work and work Statistician.  Access to firearms.  Method of birth control.  Menstrual cycle.  Pregnancy history. What immunizations do I need? Vaccines are usually given at various ages, according to a schedule. Your health care provider will recommend vaccines for you based on your age, medical history, and lifestyle or other factors, such as travel or where you work.   What tests do I need? Blood tests  Lipid and cholesterol levels. These may be checked every 5 years, or more often if you are over 61 years old.  Hepatitis C test.  Hepatitis B test. Screening  Lung cancer screening. You may have this screening every year starting at age 61 if you have a 30-pack-year history of smoking and currently smoke or have quit within the past 15 years.  Colorectal cancer  screening. ? All adults should have this screening starting at age 61 and continuing until age 17. ? Your health care provider may recommend screening at age 61 if you are at increased risk. ? You will have tests every 1-10 years, depending on your results and the type of screening test.  Diabetes screening. ? This is done by checking your blood sugar (glucose) after you have not eaten for a while (fasting). ? You may have this done every 1-3 years.  Mammogram. ? This may be done every 1-2 years. ? Talk with your health care provider about when you should start having regular mammograms. This may depend on whether you have a family history of breast cancer.  BRCA-related cancer screening. This may be done if you have a family history of breast, ovarian, tubal, or peritoneal cancers.  Pelvic exam and Pap test. ? This may be done every 3 years starting at age 61. ? Starting at age 61, this may be done every 5 years if you have a Pap test in combination with an HPV test. Other tests  STD (sexually transmitted disease) testing, if you are at risk.  Bone density scan. This is done to screen for osteoporosis. You may have this scan if you are at high risk for osteoporosis. Talk with your health care provider about your test results, treatment options, and if necessary, the need for more tests. Follow these instructions at home: Eating and drinking  Eat a diet that includes fresh fruits and vegetables, whole grains, lean protein, and low-fat dairy products.  Take vitamin and mineral supplements  as recommended by your health care provider.  Do not drink alcohol if: ? Your health care provider tells you not to drink. ? You are pregnant, may be pregnant, or are planning to become pregnant.  If you drink alcohol: ? Limit how much you have to 0-1 drink a day. ? Be aware of how much alcohol is in your drink. In the [place] one drink equals one 12 oz bottle of beer (355 mL), one 5 oz glass of  wine (148 mL), or one 1 oz glass of hard liquor (44 mL).   Lifestyle  Take daily care of your teeth and gums. Brush your teeth every morning and night with fluoride toothpaste. Floss one time each day.  Stay active. Exercise for at least 30 minutes 5 or more days each week.  Do not use any products that contain nicotine or tobacco, such as cigarettes, e-cigarettes, and chewing tobacco. If you need help quitting, ask your health care provider.  Do not use drugs.  If you are sexually active, practice safe sex. Use a condom or other form of protection to prevent STIs (sexually transmitted infections).  If you do not wish to become pregnant, use a form of birth control. If you plan to become pregnant, see your health care provider for a prepregnancy visit.  If told by your health care provider, take low-dose aspirin daily starting at age 61.  Find healthy ways to cope with stress, such as: ? Meditation, yoga, or listening to music. ? Journaling. ? Talking to a trusted person. ? Spending time with friends and family. Safety  Always wear your seat belt while driving or riding in a vehicle.  Do not drive: ? If you have been drinking alcohol. Do not ride with someone who has been drinking. ? When you are tired or distracted. ? While texting.  Wear a helmet and other protective equipment during sports activities.  If you have firearms in your house, make sure you follow all gun safety procedures. What's next?  Visit your health care provider once a year for an annual wellness visit.  Ask your health care provider how often you should have your eyes and teeth checked.  Stay up to date on all vaccines. This information is not intended to replace advice given to you by your health care provider. Make sure you discuss any questions you have with your health care provider. Document Revised: 12/15/2019 Document Reviewed: 11/21/2017 Elsevier Patient Education  2021 [employer]  

## 2020-07-11 NOTE — Assessment & Plan Note (Signed)
BP elevated today.  Restart lisinopril 10mg  daily with follow up in 4 weeks

## 2020-07-12 ENCOUNTER — Other Ambulatory Visit: Payer: Self-pay | Admitting: Family Medicine

## 2020-07-12 MED ORDER — ROSUVASTATIN CALCIUM 40 MG PO TABS: 40.0000 mg | ORAL_TABLET | Freq: Every day | ORAL | 3 refills | Status: DC

## 2020-08-05 ENCOUNTER — Other Ambulatory Visit: Payer: Self-pay | Admitting: Family Medicine

## 2020-08-05 DIAGNOSIS — Z1231 Encounter for screening mammogram for malignant neoplasm of breast: Secondary | ICD-10-CM

## 2020-08-31 ENCOUNTER — Ambulatory Visit (INDEPENDENT_AMBULATORY_CARE_PROVIDER_SITE_OTHER): Payer: 59

## 2020-08-31 ENCOUNTER — Ambulatory Visit: Payer: 59

## 2020-08-31 ENCOUNTER — Other Ambulatory Visit: Payer: Self-pay

## 2020-08-31 DIAGNOSIS — Z1231 Encounter for screening mammogram for malignant neoplasm of breast: Secondary | ICD-10-CM | POA: Diagnosis not present

## 2021-01-06 ENCOUNTER — Other Ambulatory Visit: Payer: Self-pay | Admitting: Family Medicine

## 2021-07-15 ENCOUNTER — Other Ambulatory Visit: Payer: Self-pay | Admitting: Family Medicine

## 2021-07-17 ENCOUNTER — Encounter: Payer: 59 | Admitting: Family Medicine

## 2021-07-23 IMAGING — MG MM DIGITAL SCREENING BILAT W/ TOMO W/ CAD
6 of 12 series · 6 of 36 positions shown · non-contrast
Comparison: Previous exam(s).

CLINICAL DATA: Screening.

EXAM:
DIGITAL SCREENING BILATERAL MAMMOGRAM WITH TOMO AND CAD

[R MLO synth-2D]
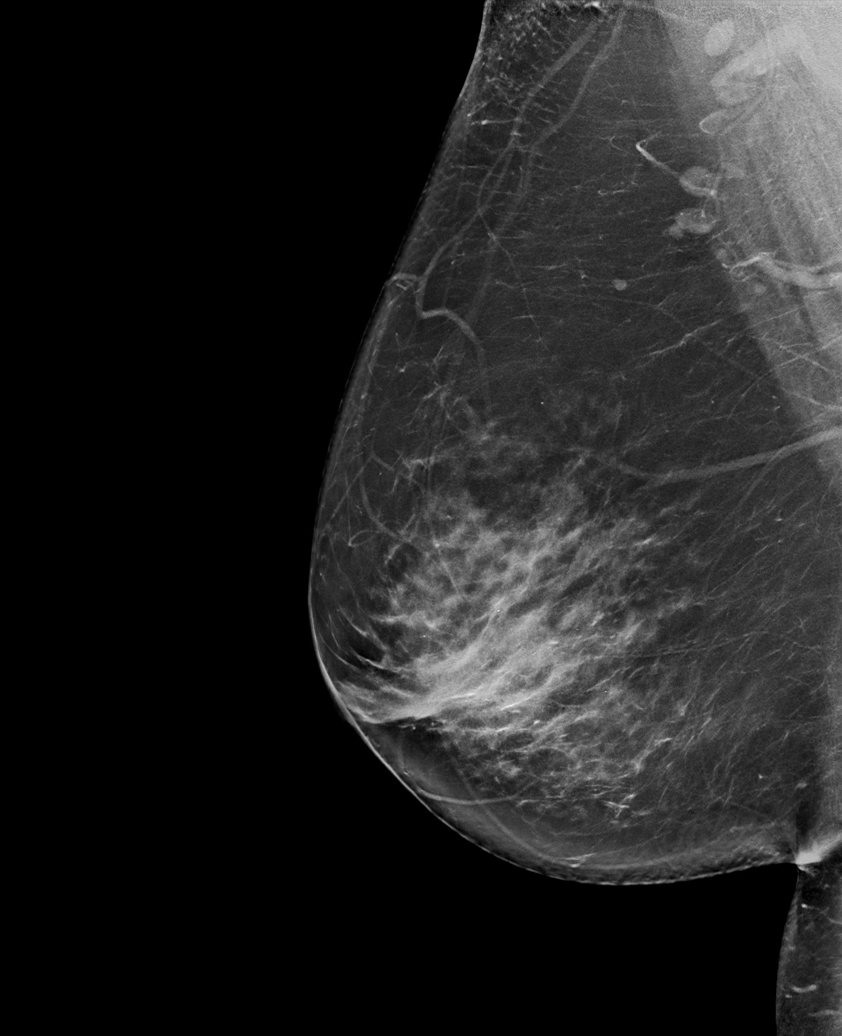

[R CC synth-2D]
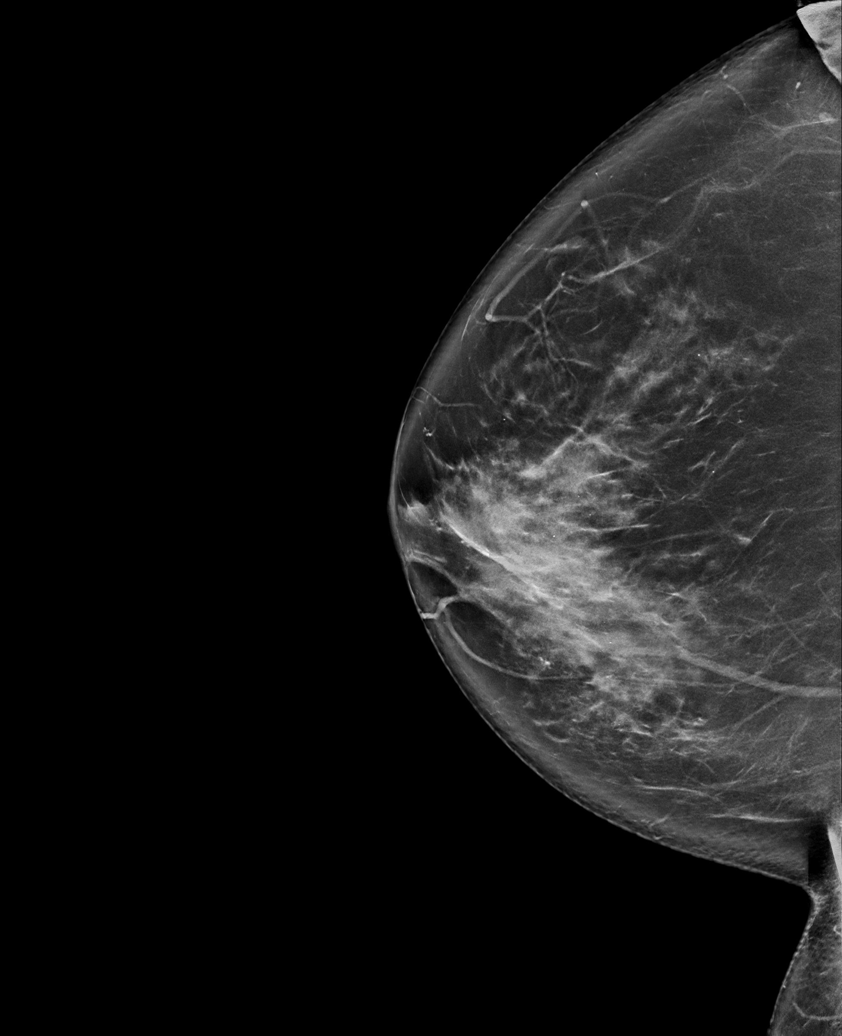

[L XCCL synth-2D]
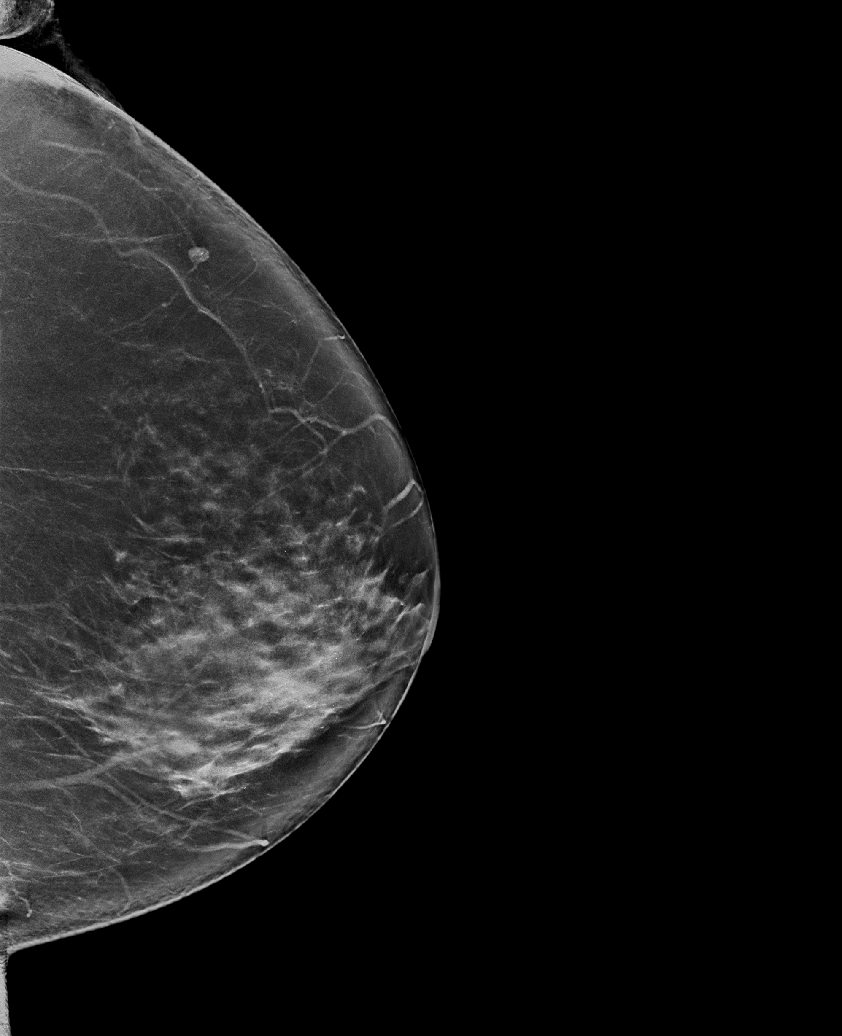

[L CC synth-2D]
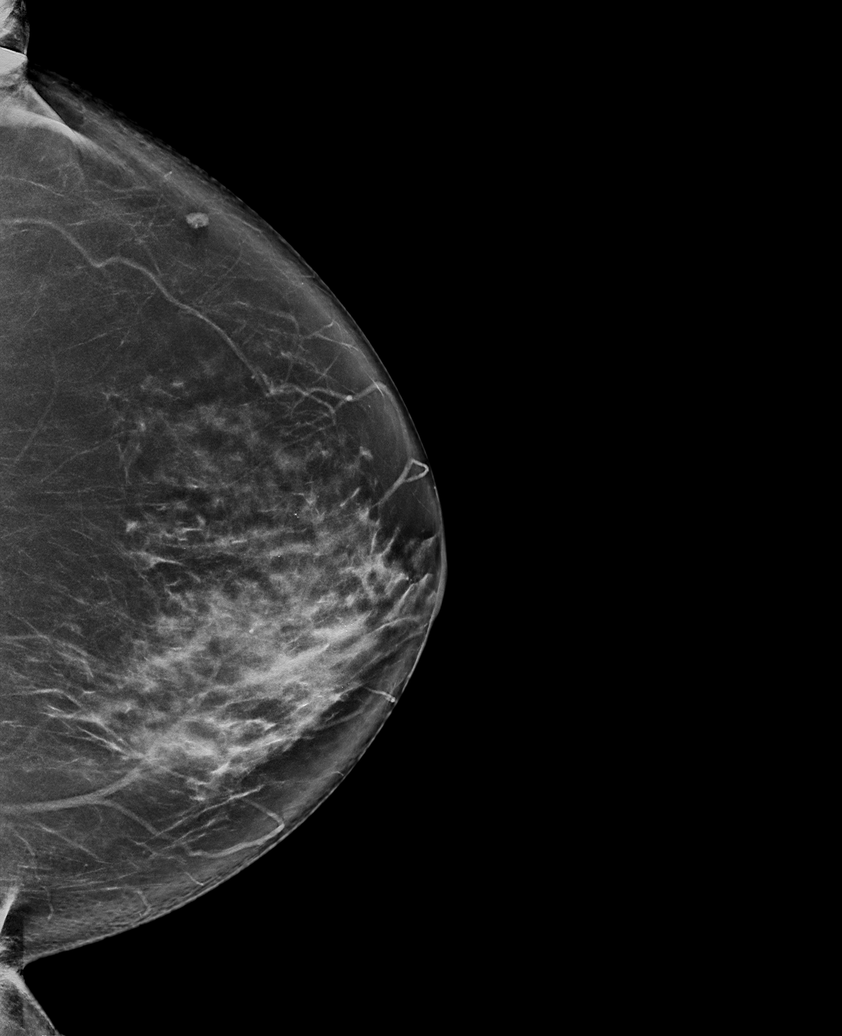

[L MLO synth-2D]
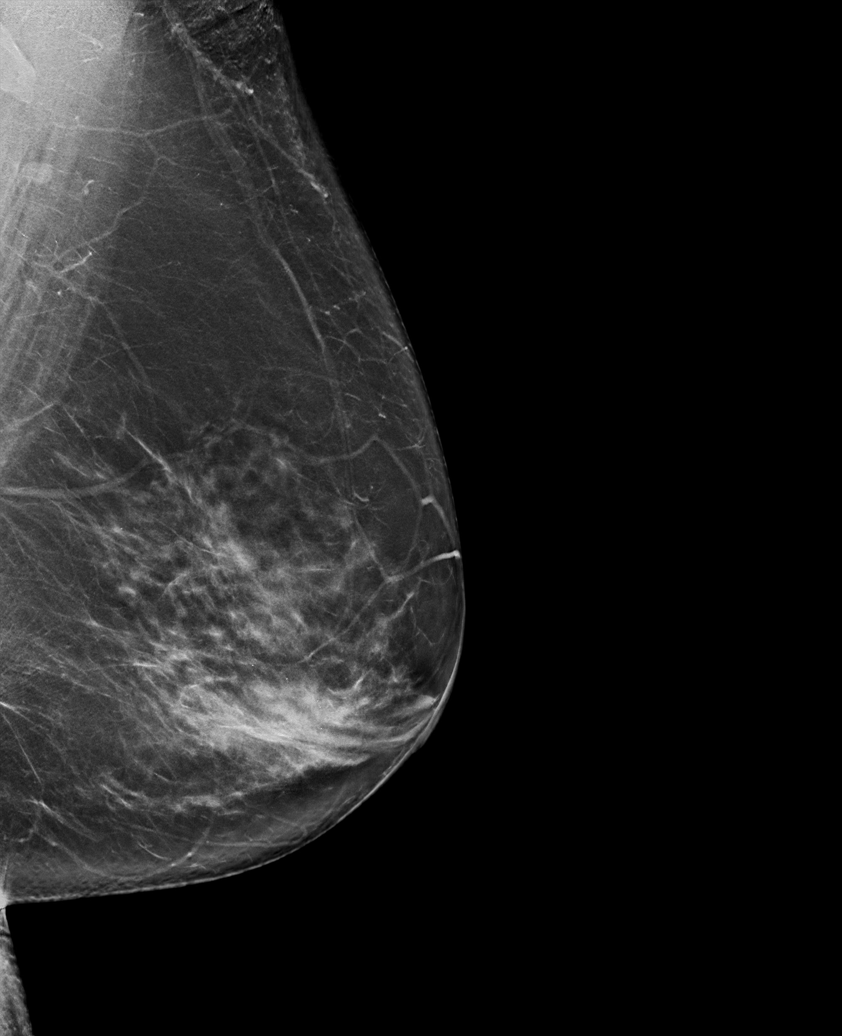

[R XCCL synth-2D]
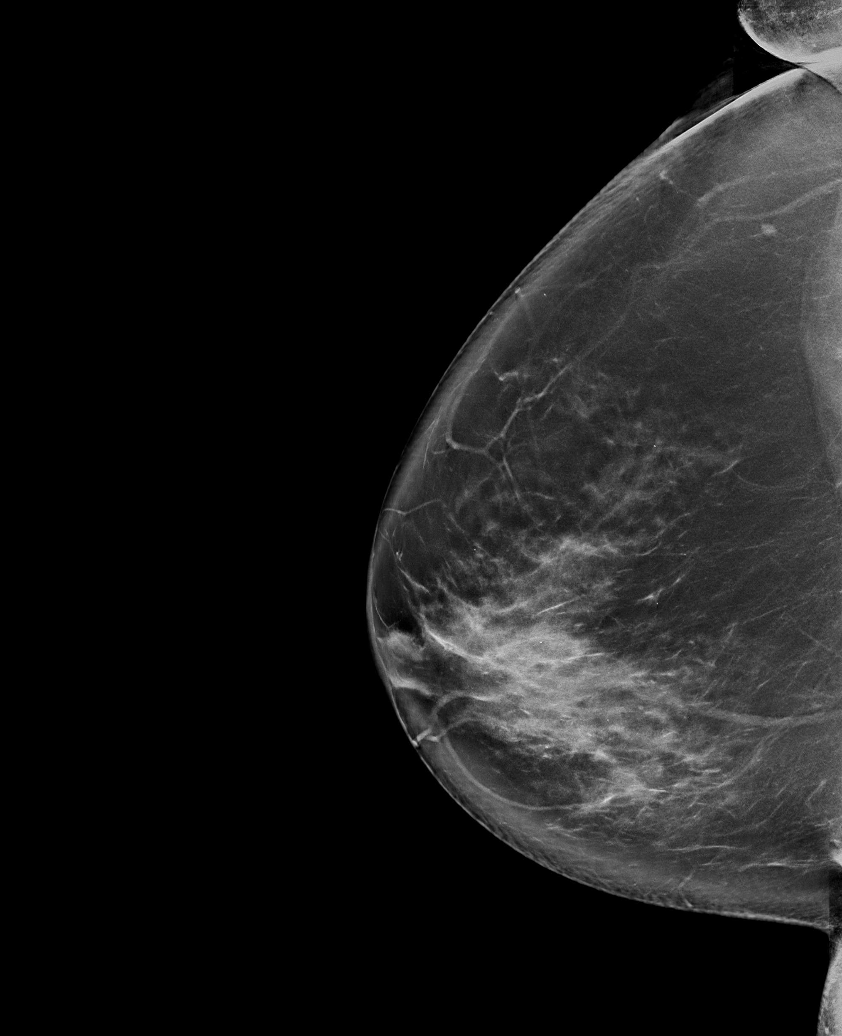

[6 of 36 positions shown; findings below may reference images not displayed]

ACR Breast Density Category c: The breast tissue is heterogeneously
dense, which may obscure small masses.
FINDINGS: There are no findings suspicious for malignancy. Images were
processed with CAD.
IMPRESSION: No mammographic evidence of malignancy. A result letter of this
screening mammogram will be mailed directly to the patient.

RECOMMENDATION:
Screening mammogram in one year. (Code:[id number])

BI-RADS CATEGORY  1: Negative.

## 2021-07-31 ENCOUNTER — Encounter: Payer: Self-pay | Admitting: Physician Assistant

## 2021-07-31 ENCOUNTER — Ambulatory Visit (INDEPENDENT_AMBULATORY_CARE_PROVIDER_SITE_OTHER): Payer: 59 | Admitting: Physician Assistant

## 2021-07-31 VITALS — BP 138/82 | HR 72 | Ht 59.0 in | Wt 150.0 lb

## 2021-07-31 DIAGNOSIS — Z1329 Encounter for screening for other suspected endocrine disorder: Secondary | ICD-10-CM

## 2021-07-31 DIAGNOSIS — I10 Essential (primary) hypertension: Secondary | ICD-10-CM | POA: Diagnosis not present

## 2021-07-31 DIAGNOSIS — E782 Mixed hyperlipidemia: Secondary | ICD-10-CM

## 2021-07-31 DIAGNOSIS — Z0000 Encounter for general adult medical examination without abnormal findings: Secondary | ICD-10-CM

## 2021-07-31 DIAGNOSIS — Z23 Encounter for immunization: Secondary | ICD-10-CM

## 2021-07-31 MED ORDER — ROSUVASTATIN CALCIUM 40 MG PO TABS: 40.0000 mg | ORAL_TABLET | Freq: Every day | ORAL | 3 refills | Status: DC

## 2021-07-31 MED ORDER — LISINOPRIL 10 MG PO TABS: ORAL_TABLET | ORAL | 0 refills | Status: DC

## 2021-07-31 NOTE — Progress Notes (Signed)
? ?Complete physical exam ? ?Patient: Michele Burch   DOB: 30-Jan-1961   61 y.o. Female  MRN: [id number] ? ?Subjective:  ?  ?Chief Complaint  ?Patient presents with  ? Annual Exam  ? ? ?Michele Burch is a 61 y.o. female who presents today for a complete physical exam. She reports consuming a general diet. The patient does not participate in regular exercise at present. She generally feels well. She reports sleeping well. She does not have additional problems to discuss today.  ? ? ?Most recent fall risk assessment: ? ?  07/31/2021  ?  9:01 AM  ?Fall Risk   ?Falls in the past year? 0  ?Number falls in past yr: 0  ?Injury with Fall? 0  ?Risk for fall due to : No Fall Risks  ?Follow up Falls evaluation completed  ? ?  ?Most recent depression screenings: ? ?  07/31/2021  ?  9:01 AM 07/11/2020  ?  9:45 AM  ?PHQ 2/9 Scores  ?PHQ - 2 Score 0 0  ? ? ?Vision:Within last year and Dental: No current dental problems ? ?Patient Active Problem List  ? Diagnosis Date Noted  ? Well adult exam 07/11/2020  ? BMI 29.0-29.9,adult 01/19/2019  ? HLD (hyperlipidemia) 04/30/2016  ? HTN (hypertension) 01/03/2015  ? ?No family history on file. ?No Known Allergies ?  ? ?Patient Care Team: ?Everrett Coombe, DO as PCP - General (Family Medicine)  ? ?Outpatient Medications Prior to Visit  ?Medication Sig  ? diclofenac sodium (VOLTAREN) 1 % GEL Apply 2 g 4 (four) times daily topically. To affected joint.  ? [DISCONTINUED] rosuvastatin (CRESTOR) 40 MG tablet TAKE 1 TABLET BY MOUTH EVERY DAY  ? [DISCONTINUED] lisinopril (ZESTRIL) 10 MG tablet TAKE 1 TABLET BY MOUTH EVERY DAY. NO REFILLS. NEEDS A F/U APPT. (Patient not taking: Reported on 07/31/2021)  ? ?No facility-administered medications prior to visit.  ? ? ?Review of Systems  ?All other systems reviewed and are negative. ? ? ? ? ?   ?Objective:  ? ?  ?BP 138/82   Pulse 72   Ht 4\' 11"  (1.499 m)   Wt 150 lb (68 kg)   SpO2 100%   BMI 30.30 kg/m?  ?BP Readings from Last 3 Encounters:  ?07/31/21 138/82   ?07/11/20 (!) 184/84  ?01/19/19 (!) 168/90  ? ?Wt Readings from Last 3 Encounters:  ?07/31/21 150 lb (68 kg)  ?07/11/20 147 lb 9.6 oz (67 kg)  ?01/19/19 146 lb (66.2 kg)  ? ?  ? ?Physical Exam  ?BP 138/82   Pulse 72   Ht 4\' 11"  (1.499 m)   Wt 150 lb (68 kg)   SpO2 100%   BMI 30.30 kg/m?  ? ?General Appearance:    Alert, cooperative, no distress, appears stated age  ?Head:    Normocephalic, without obvious abnormality, atraumatic  ?Eyes:    PERRL, conjunctiva/corneas clear, EOM's intact, fundi  ?  benign, both eyes  ?Ears:    Normal TM's and external ear canals, both ears  ?Nose:   Nares normal, septum midline, mucosa normal, no drainage    or sinus tenderness  ?Throat:   Lips, mucosa, and tongue normal; teeth and gums normal  ?Neck:   Supple, symmetrical, trachea midline, no adenopathy;  ?  thyroid:  no enlargement/tenderness/nodules; no carotid ?  bruit or JVD  ?Back:     Symmetric, no curvature, ROM normal, no CVA tenderness  ?Lungs:     Clear to auscultation bilaterally, respirations unlabored  ?Chest Wall:  No tenderness or deformity  ? Heart:    Regular rate and rhythm, S1 and S2 normal, no murmur, rub   or gallop  ?   ?Abdomen:     Soft, non-tender, bowel sounds active all four quadrants,  ?  no masses, no organomegaly  ?   ?   ?Extremities:   Extremities normal, atraumatic, no cyanosis or edema  ?Pulses:   2+ and symmetric all extremities  ?Skin:   Skin color, texture, turgor normal, no rashes or lesions  ?Lymph nodes:   Cervical, supraclavicular, and axillary nodes normal  ?Neurologic:   CNII-XII intact, normal strength, sensation and reflexes  ?  throughout  ? ?   ?Assessment & Plan:  ?  ?Routine Health Maintenance and Physical Exam ? ?Immunization History  ?Administered Date(s) Administered  ? Influenza,inj,Quad PF,6+ Mos 01/15/2018  ? Influenza-Unspecified 12/30/2014, 01/08/2019, 01/07/2020  ? PFIZER(Purple Top)SARS-COV-2 Vaccination 06/18/2019, 07/20/2019, 03/06/2020, 05/25/2021  ? Tdap  01/03/2015  ? Zoster Recombinat (Shingrix) 07/11/2020, 07/31/2021  ? ? ?Health Maintenance  ?Topic Date Due  ? PAP SMEAR-Modifier  07/31/2021 (Originally 07/10/1980)  ? COVID-19 Vaccine (5 - Booster for Pfizer series) 08/16/2021 (Originally 07/20/2021)  ? INFLUENZA VACCINE  10/24/2021  ? Fecal DNA (Cologuard)  02/05/2022  ? MAMMOGRAM  09/01/2022  ? TETANUS/TDAP  01/02/2025  ? Hepatitis C Screening  Completed  ? HIV Screening  Completed  ? Zoster Vaccines- Shingrix  Completed  ? HPV VACCINES  Aged Out  ? ? ?Discussed health benefits of physical activity, and encouraged her to engage in regular exercise appropriate for her age and condition. ? ?Reports BP better at home. Has not been taking lisinopril because ran out of it.  ?Restart lisinopril. ?Keep BP log. ?Follow up in 3 months.  ?PHQ/GAD no concerns. ?Declined pap-needs to reschedule ?Fasting labs ordered ?Cologuard UTD ?Mammogram UTD ?Covid vaccine x4 ?Shingrix UTD ?Flu shot UTD ? ?  ? ?Tandy Gaw, PA-C ? ? ?

## 2021-07-31 NOTE — Patient Instructions (Addendum)
Biotin daily ?Vitamin D 1000 units ?Vitamin C 1200mg  ? ?Health Maintenance, Female ?Adopting a healthy lifestyle and getting preventive care are important in promoting health and wellness. Ask your health care provider about: ?The right schedule for you to have regular tests and exams. ?Things you can do on your own to prevent diseases and keep yourself healthy. ?What should I know about diet, weight, and exercise? ?Eat a healthy diet ? ?Eat a diet that includes plenty of vegetables, fruits, low-fat dairy products, and lean protein. ?Do not eat a lot of foods that are high in solid fats, added sugars, or sodium. ?Maintain a healthy weight ?Body mass index (BMI) is used to identify weight problems. It estimates body fat based on height and weight. Your health care provider can help determine your BMI and help you achieve or maintain a healthy weight. ?Get regular exercise ?Get regular exercise. This is one of the most important things you can do for your health. Most adults should: ?Exercise for at least 150 minutes each week. The exercise should increase your heart rate and make you sweat (moderate-intensity exercise). ?Do strengthening exercises at least twice a week. This is in addition to the moderate-intensity exercise. ?Spend less time sitting. Even light physical activity can be beneficial. ?Watch cholesterol and blood lipids ?Have your blood tested for lipids and cholesterol at 62 years of age, then have this test every 5 years. ?Have your cholesterol levels checked more often if: ?Your lipid or cholesterol levels are high. ?You are older than 62 years of age. ?You are at high risk for heart disease. ?What should I know about cancer screening? ?Depending on your health history and family history, you may need to have cancer screening at various ages. This may include screening for: ?Breast cancer. ?Cervical cancer. ?Colorectal cancer. ?Skin cancer. ?Lung cancer. ?What should I know about heart disease,  diabetes, and high blood pressure? ?Blood pressure and heart disease ?High blood pressure causes heart disease and increases the risk of stroke. This is more likely to develop in people who have high blood pressure readings or are overweight. ?Have your blood pressure checked: ?Every 3-5 years if you are 89-19 years of age. ?Every year if you are 26 years old or older. ?Diabetes ?Have regular diabetes screenings. This checks your fasting blood sugar level. Have the screening done: ?Once every three years after age 22 if you are at a normal weight and have a low risk for diabetes. ?More often and at a younger age if you are overweight or have a high risk for diabetes. ?What should I know about preventing infection? ?Hepatitis B ?If you have a higher risk for hepatitis B, you should be screened for this virus. Talk with your health care provider to find out if you are at risk for hepatitis B infection. ?Hepatitis C ?Testing is recommended for: ?Everyone born from [birth year] through 1966. ?Anyone with known risk factors for hepatitis C. ?Sexually transmitted infections (STIs) ?Get screened for STIs, including gonorrhea and chlamydia, if: ?You are sexually active and are younger than 62 years of age. ?You are older than 62 years of age and your health care provider tells you that you are at risk for this type of infection. ?Your sexual activity has changed since you were last screened, and you are at increased risk for chlamydia or gonorrhea. Ask your health care provider if you are at risk. ?Ask your health care provider about whether you are at high risk for HIV. Your health care  provider may recommend a prescription medicine to help prevent HIV infection. If you choose to take medicine to prevent HIV, you should first get tested for HIV. You should then be tested every 3 months for as long as you are taking the medicine. ?Pregnancy ?If you are about to stop having your period (premenopausal) and you may become pregnant,  seek counseling before you get pregnant. ?Take 400 to 800 micrograms (mcg) of folic acid every day if you become pregnant. ?Ask for birth control (contraception) if you want to prevent pregnancy. ?Osteoporosis and menopause ?Osteoporosis is a disease in which the bones lose minerals and strength with aging. This can result in bone fractures. If you are 54 years old or older, or if you are at risk for osteoporosis and fractures, ask your health care provider if you should: ?Be screened for bone loss. ?Take a calcium or vitamin D supplement to lower your risk of fractures. ?Be given hormone replacement therapy (HRT) to treat symptoms of menopause. ?Follow these instructions at home: ?Alcohol use ?Do not drink alcohol if: ?Your health care provider tells you not to drink. ?You are pregnant, may be pregnant, or are planning to become pregnant. ?If you drink alcohol: ?Limit how much you have to: ?0-1 drink a day. ?Know how much alcohol is in your drink. In the [place] one drink equals one 12 oz bottle of beer (355 mL), one 5 oz glass of wine (148 mL), or one 1? oz glass of hard liquor (44 mL). ?Lifestyle ?Do not use any products that contain nicotine or tobacco. These products include cigarettes, chewing tobacco, and vaping devices, such as e-cigarettes. If you need help quitting, ask your health care provider. ?Do not use street drugs. ?Do not share needles. ?Ask your health care provider for help if you need support or information about quitting drugs. ?General instructions ?Schedule regular health, dental, and eye exams. ?Stay current with your vaccines. ?Tell your health care provider if: ?You often feel depressed. ?You have ever been abused or do not feel safe at home. ?Summary ?Adopting a healthy lifestyle and getting preventive care are important in promoting health and wellness. ?Follow your health care provider's instructions about healthy diet, exercising, and getting tested or screened for diseases. ?Follow your  health care provider's instructions on monitoring your cholesterol and blood pressure. ?This information is not intended to replace advice given to you by your health care provider. Make sure you discuss any questions you have with your health care provider. ?Document Revised: 08/01/2020 Document Reviewed: 08/01/2020 ?Elsevier Patient Education ? [employer]. ? ?

## 2021-08-01 ENCOUNTER — Encounter: Payer: Self-pay | Admitting: Neurology

## 2021-08-01 LAB — CBC WITH DIFFERENTIAL/PLATELET
Absolute Monocytes: 290 cells/uL (ref 200–950)
Basophils Absolute: 50 cells/uL (ref 0–200)
Basophils Relative: 1.2 %
Eosinophils Absolute: 101 cells/uL (ref 15–500)
Eosinophils Relative: 2.4 %
HCT: 46.2 % — ABNORMAL HIGH (ref 35.0–45.0)
Hemoglobin: 15 g/dL (ref 11.7–15.5)
Lymphs Abs: 1819 cells/uL (ref 850–3900)
MCH: 29 pg (ref 27.0–33.0)
MCHC: 32.5 g/dL (ref 32.0–36.0)
MCV: 89.4 fL (ref 80.0–100.0)
MPV: 9.4 fL (ref 7.5–12.5)
Monocytes Relative: 6.9 %
Neutro Abs: 1940 cells/uL (ref 1500–7800)
Neutrophils Relative %: 46.2 %
Platelets: 279 10*3/uL (ref 140–400)
RBC: 5.17 10*6/uL — ABNORMAL HIGH (ref 3.80–5.10)
RDW: 12.8 % (ref 11.0–15.0)
Total Lymphocyte: 43.3 %
WBC: 4.2 10*3/uL (ref 3.8–10.8)

## 2021-08-01 LAB — TSH: TSH: 2.1 mIU/L (ref 0.40–4.50)

## 2021-08-01 LAB — COMPLETE METABOLIC PANEL WITH GFR
AG Ratio: 1.3 (calc) (ref 1.0–2.5)
ALT: 20 U/L (ref 6–29)
AST: 19 U/L (ref 10–35)
Albumin: 4.3 g/dL (ref 3.6–5.1)
Alkaline phosphatase (APISO): 81 U/L (ref 37–153)
BUN: 12 mg/dL (ref 7–25)
CO2: 27 mmol/L (ref 20–32)
Calcium: 9.4 mg/dL (ref 8.6–10.4)
Chloride: 107 mmol/L (ref 98–110)
Creat: 0.64 mg/dL (ref 0.50–1.05)
Globulin: 3.2 g/dL (calc) (ref 1.9–3.7)
Glucose, Bld: 95 mg/dL (ref 65–139)
Potassium: 4.3 mmol/L (ref 3.5–5.3)
Sodium: 142 mmol/L (ref 135–146)
Total Bilirubin: 0.6 mg/dL (ref 0.2–1.2)
Total Protein: 7.5 g/dL (ref 6.1–8.1)
eGFR: 100 mL/min/{1.73_m2} (ref >=60)

## 2021-08-01 LAB — LIPID PANEL W/REFLEX DIRECT LDL
Cholesterol: 164 mg/dL (ref <200)
HDL: 65 mg/dL (ref >=50)
LDL Cholesterol (Calc): 79 mg/dL (calc)
Non-HDL Cholesterol (Calc): 99 mg/dL (calc) (ref <130)
Total CHOL/HDL Ratio: 2.5 (calc) (ref <5.0)
Triglycerides: 110 mg/dL (ref <150)

## 2021-08-01 NOTE — Progress Notes (Signed)
Pt does request labs to be mailed to home.

## 2021-08-01 NOTE — Progress Notes (Signed)
Thyroid looks great.  ?Cholesterol looks soo much better. Stay on cholesterol medication.  ?Kidney, liver, glucose looks amazing.  ?Hemoglobin and WBC looks good.

## 2021-10-14 ENCOUNTER — Other Ambulatory Visit: Payer: Self-pay | Admitting: Physician Assistant

## 2022-02-03 ENCOUNTER — Other Ambulatory Visit: Payer: Self-pay | Admitting: Family Medicine

## 2022-02-05 NOTE — Telephone Encounter (Signed)
Lvm for patient to call back to schedule an appointment for hypertension and further med refills. tvt

## 2022-02-22 ENCOUNTER — Other Ambulatory Visit: Payer: Self-pay | Admitting: Family Medicine

## 2022-07-23 ENCOUNTER — Ambulatory Visit (INDEPENDENT_AMBULATORY_CARE_PROVIDER_SITE_OTHER): Payer: BC Managed Care – PPO | Admitting: Family Medicine

## 2022-07-23 ENCOUNTER — Encounter: Payer: Self-pay | Admitting: Family Medicine

## 2022-07-23 VITALS — BP 134/75 | HR 73 | Ht 59.0 in | Wt 137.0 lb

## 2022-07-23 DIAGNOSIS — I10 Essential (primary) hypertension: Secondary | ICD-10-CM

## 2022-07-23 DIAGNOSIS — Z1211 Encounter for screening for malignant neoplasm of colon: Secondary | ICD-10-CM

## 2022-07-23 DIAGNOSIS — Z0000 Encounter for general adult medical examination without abnormal findings: Secondary | ICD-10-CM

## 2022-07-23 DIAGNOSIS — E782 Mixed hyperlipidemia: Secondary | ICD-10-CM

## 2022-07-23 MED ORDER — ROSUVASTATIN CALCIUM 40 MG PO TABS: 40.0000 mg | ORAL_TABLET | Freq: Every day | ORAL | 3 refills | Status: DC

## 2022-07-23 MED ORDER — LISINOPRIL 10 MG PO TABS: ORAL_TABLET | ORAL | 1 refills | Status: DC

## 2022-07-23 NOTE — Progress Notes (Signed)
Michele Burch - 62 y.o. female MRN [id number]  Date of birth: 10-21-1960  Subjective Chief Complaint  Patient presents with   Annual Exam    HPI Michele Burch is a 62 y.o. female here today for annual exam.   She reports that she is doing well at this time.  BP is elevated today.  She is taking lisinopril daily, but has been out for a few days.. She denies side effects at current strength.   Activity level is moderate.  She feels that diet is pretty good.   She is a non-smoker.  No EtOH use.   She is due for colon cancer screening.  Would like to continue with cologuard.   She plans to have PAP with GYN.    Review of Systems  Constitutional:  Negative for chills, fever, malaise/fatigue and weight loss.  HENT:  Negative for congestion, ear pain and sore throat.   Eyes:  Negative for blurred vision, double vision and pain.  Respiratory:  Negative for cough and shortness of breath.   Cardiovascular:  Negative for chest pain and palpitations.  Gastrointestinal:  Negative for abdominal pain, blood in stool, constipation, heartburn and nausea.  Genitourinary:  Negative for dysuria and urgency.  Musculoskeletal:  Negative for joint pain and myalgias.  Neurological:  Negative for dizziness and headaches.  Endo/Heme/Allergies:  Does not bruise/bleed easily.  Psychiatric/Behavioral:  Negative for depression. The patient is not nervous/anxious and does not have insomnia.     No Known Allergies  Past Medical History:  Diagnosis Date   HLD (hyperlipidemia) 04/30/2016   HTN (hypertension) 01/03/2015    Past Surgical History:  Procedure Laterality Date   APPENDECTOMY      Social History   Socioeconomic History   Marital status: Unknown    Spouse name: Not on file   Number of children: Not on file   Years of education: Not on file   Highest education level: Not on file  Occupational History   Not on file  Tobacco Use   Smoking status: Never   Smokeless tobacco: Never   Substance and Sexual Activity   Alcohol use: No    Alcohol/week: 0.0 standard drinks of alcohol   Drug use: No   Sexual activity: Never  Other Topics Concern   Not on file  Social History Narrative   Not on file   Social Determinants of Health   Financial Resource Strain: Not on file  Food Insecurity: Not on file  Transportation Needs: Not on file  Physical Activity: Not on file  Stress: Not on file  Social Connections: Not on file    History reviewed. No pertinent family history.  Health Maintenance  Topic Date Due   PAP SMEAR-Modifier  01/25/2023 (Originally 07/10/1980)   COVID-19 Vaccine (5 - 2023-24 season) 01/25/2023 (Originally 11/24/2021)   Fecal DNA (Cologuard)  01/25/2023 (Originally 02/05/2022)   MAMMOGRAM  09/01/2022   INFLUENZA VACCINE  10/25/2022   DTaP/Tdap/Td (2 - Td or Tdap) 01/02/2025   Hepatitis C Screening  Completed   HIV Screening  Completed   Zoster Vaccines- Shingrix  Completed   HPV VACCINES  Aged Out     ----------------------------------------------------------------------------------------------------------------------------------------------------------------------------------------------------------------- Physical Exam BP (!) 142/83 (BP Location: Left Arm, Patient Position: Sitting, Cuff Size: Normal)   Pulse 73   Ht 4\' 11"  (1.499 m)   Wt 137 lb (62.1 kg)   SpO2 95%   BMI 27.67 kg/m   Physical Exam Constitutional:      General: She is not in  acute distress. HENT:     Head: Normocephalic and atraumatic.     Right Ear: Tympanic membrane and ear canal normal.     Left Ear: Tympanic membrane and ear canal normal.     Nose: Nose normal.  Eyes:     General: No scleral icterus.    Conjunctiva/sclera: Conjunctivae normal.  Neck:     Thyroid: No thyromegaly.  Cardiovascular:     Rate and Rhythm: Normal rate and regular rhythm.     Heart sounds: Normal heart sounds.  Pulmonary:     Effort: Pulmonary effort is normal.     Breath  sounds: Normal breath sounds.  Abdominal:     General: Bowel sounds are normal. There is no distension.     Palpations: Abdomen is soft.     Tenderness: There is no abdominal tenderness. There is no guarding.  Musculoskeletal:        General: Normal range of motion.     Cervical back: Normal range of motion and neck supple.  Lymphadenopathy:     Cervical: No cervical adenopathy.  Skin:    General: Skin is warm and dry.     Findings: No rash.  Neurological:     General: No focal deficit present.     Mental Status: She is alert and oriented to person, place, and time.     Cranial Nerves: No cranial nerve deficit.     Coordination: Coordination normal.  Psychiatric:        Mood and Affect: Mood normal.        Behavior: Behavior normal.     ------------------------------------------------------------------------------------------------------------------------------------------------------------------------------------------------------------------- Assessment and Plan  Well adult exam Well adult Orders Placed This Encounter  Procedures   COMPLETE METABOLIC PANEL WITH GFR   CBC with Differential   Lipid Panel w/reflex Direct LDL   TSH   Cologuard  Screenings: per lab orders.  Immunizations: UTD Anticipatory guidance/Risk factor reduction:  Recommendations per AVS.    Meds ordered this encounter  Medications   lisinopril (ZESTRIL) 10 MG tablet    Sig: TAKE 1 TABLET BY MOUTH EVERY DAY/ appt for further refills    Dispense:  90 tablet    Refill:  1   rosuvastatin (CRESTOR) 40 MG tablet    Sig: Take 1 tablet (40 mg total) by mouth daily.    Dispense:  90 tablet    Refill:  3    No follow-ups on file.    This visit occurred during the SARS-CoV-2 public health emergency.  Safety protocols were in place, including screening questions prior to the visit, additional usage of staff PPE, and extensive cleaning of exam room while observing appropriate contact time as indicated  for disinfecting solutions.

## 2022-07-23 NOTE — Patient Instructions (Signed)
Preventive Care 40-64 Years Old, Female Preventive care refers to lifestyle choices and visits with your health care provider that can promote health and wellness. Preventive care visits are also called wellness exams. What can I expect for my preventive care visit? Counseling Your health care provider may ask you questions about your: Medical history, including: Past medical problems. Family medical history. Pregnancy history. Current health, including: Menstrual cycle. Method of birth control. Emotional well-being. Home life and relationship well-being. Sexual activity and sexual health. Lifestyle, including: Alcohol, nicotine or tobacco, and drug use. Access to firearms. Diet, exercise, and sleep habits. Work and work environment. Sunscreen use. Safety issues such as seatbelt and bike helmet use. Physical exam Your health care provider will check your: Height and weight. These may be used to calculate your BMI (body mass index). BMI is a measurement that tells if you are at a healthy weight. Waist circumference. This measures the distance around your waistline. This measurement also tells if you are at a healthy weight and may help predict your risk of certain diseases, such as type 2 diabetes and high blood pressure. Heart rate and blood pressure. Body temperature. Skin for abnormal spots. What immunizations do I need?  Vaccines are usually given at various ages, according to a schedule. Your health care provider will recommend vaccines for you based on your age, medical history, and lifestyle or other factors, such as travel or where you work. What tests do I need? Screening Your health care provider may recommend screening tests for certain conditions. This may include: Lipid and cholesterol levels. Diabetes screening. This is done by checking your blood sugar (glucose) after you have not eaten for a while (fasting). Pelvic exam and Pap test. Hepatitis B test. Hepatitis C  test. HIV (human immunodeficiency virus) test. STI (sexually transmitted infection) testing, if you are at risk. Lung cancer screening. Colorectal cancer screening. Mammogram. Talk with your health care provider about when you should start having regular mammograms. This may depend on whether you have a family history of breast cancer. BRCA-related cancer screening. This may be done if you have a family history of breast, ovarian, tubal, or peritoneal cancers. Bone density scan. This is done to screen for osteoporosis. Talk with your health care provider about your test results, treatment options, and if necessary, the need for more tests. Follow these instructions at home: Eating and drinking  Eat a diet that includes fresh fruits and vegetables, whole grains, lean protein, and low-fat dairy products. Take vitamin and mineral supplements as recommended by your health care provider. Do not drink alcohol if: Your health care provider tells you not to drink. You are pregnant, may be pregnant, or are planning to become pregnant. If you drink alcohol: Limit how much you have to 0-1 drink a day. Know how much alcohol is in your drink. In the [place] one drink equals one 12 oz bottle of beer (355 mL), one 5 oz glass of wine (148 mL), or one 1 oz glass of hard liquor (44 mL). Lifestyle Brush your teeth every morning and night with fluoride toothpaste. Floss one time each day. Exercise for at least 30 minutes 5 or more days each week. Do not use any products that contain nicotine or tobacco. These products include cigarettes, chewing tobacco, and vaping devices, such as e-cigarettes. If you need help quitting, ask your health care provider. Do not use drugs. If you are sexually active, practice safe sex. Use a condom or other form of protection to   prevent STIs. If you do not wish to become pregnant, use a form of birth control. If you plan to become pregnant, see your health care provider for a  prepregnancy visit. Take aspirin only as told by your health care provider. Make sure that you understand how much to take and what form to take. Work with your health care provider to find out whether it is safe and beneficial for you to take aspirin daily. Find healthy ways to manage stress, such as: Meditation, yoga, or listening to music. Journaling. Talking to a trusted person. Spending time with friends and family. Minimize exposure to UV radiation to reduce your risk of skin cancer. Safety Always wear your seat belt while driving or riding in a vehicle. Do not drive: If you have been drinking alcohol. Do not ride with someone who has been drinking. When you are tired or distracted. While texting. If you have been using any mind-altering substances or drugs. Wear a helmet and other protective equipment during sports activities. If you have firearms in your house, make sure you follow all gun safety procedures. Seek help if you have been physically or sexually abused. What's next? Visit your health care provider once a year for an annual wellness visit. Ask your health care provider how often you should have your eyes and teeth checked. Stay up to date on all vaccines. This information is not intended to replace advice given to you by your health care provider. Make sure you discuss any questions you have with your health care provider. Document Revised: 09/07/2020 Document Reviewed: 09/07/2020 Elsevier Patient Education  2023 [employer]  

## 2022-07-23 NOTE — Assessment & Plan Note (Signed)
Well adult Orders Placed This Encounter  Procedures   COMPLETE METABOLIC PANEL WITH GFR   CBC with Differential   Lipid Panel w/reflex Direct LDL   TSH   Cologuard  Screenings: per lab orders.  Immunizations: UTD Anticipatory guidance/Risk factor reduction:  Recommendations per AVS.

## 2022-07-24 LAB — CBC WITH DIFFERENTIAL/PLATELET
Absolute Monocytes: 309 cells/uL (ref 200–950)
Basophils Absolute: 63 cells/uL (ref 0–200)
Basophils Relative: 1 %
Eosinophils Absolute: 88 cells/uL (ref 15–500)
Eosinophils Relative: 1.4 %
HCT: 43.5 % (ref 35.0–45.0)
Hemoglobin: 14.2 g/dL (ref 11.7–15.5)
Lymphs Abs: 1670 cells/uL (ref 850–3900)
MCH: 29.3 pg (ref 27.0–33.0)
MCHC: 32.6 g/dL (ref 32.0–36.0)
MCV: 89.9 fL (ref 80.0–100.0)
MPV: 9.4 fL (ref 7.5–12.5)
Monocytes Relative: 4.9 %
Neutro Abs: 4171 cells/uL (ref 1500–7800)
Neutrophils Relative %: 66.2 %
Platelets: 308 10*3/uL (ref 140–400)
RBC: 4.84 10*6/uL (ref 3.80–5.10)
RDW: 11.9 % (ref 11.0–15.0)
Total Lymphocyte: 26.5 %
WBC: 6.3 10*3/uL (ref 3.8–10.8)

## 2022-07-24 LAB — COMPLETE METABOLIC PANEL WITH GFR
AG Ratio: 1.7 (calc) (ref 1.0–2.5)
ALT: 13 U/L (ref 6–29)
AST: 15 U/L (ref 10–35)
Albumin: 4.2 g/dL (ref 3.6–5.1)
Alkaline phosphatase (APISO): 85 U/L (ref 37–153)
BUN: 11 mg/dL (ref 7–25)
CO2: 28 mmol/L (ref 20–32)
Calcium: 9.1 mg/dL (ref 8.6–10.4)
Chloride: 106 mmol/L (ref 98–110)
Creat: 0.67 mg/dL (ref 0.50–1.05)
Globulin: 2.5 g/dL (calc) (ref 1.9–3.7)
Glucose, Bld: 89 mg/dL (ref 65–99)
Potassium: 4.2 mmol/L (ref 3.5–5.3)
Sodium: 141 mmol/L (ref 135–146)
Total Bilirubin: 0.5 mg/dL (ref 0.2–1.2)
Total Protein: 6.7 g/dL (ref 6.1–8.1)
eGFR: 98 mL/min/{1.73_m2} (ref >=60)

## 2022-07-24 LAB — LIPID PANEL W/REFLEX DIRECT LDL
Cholesterol: 177 mg/dL (ref <200)
HDL: 58 mg/dL (ref >=50)
LDL Cholesterol (Calc): 100 mg/dL (calc) — ABNORMAL HIGH
Non-HDL Cholesterol (Calc): 119 mg/dL (calc) (ref <130)
Total CHOL/HDL Ratio: 3.1 (calc) (ref <5.0)
Triglycerides: 98 mg/dL (ref <150)

## 2022-07-24 LAB — TSH: TSH: 1.89 mIU/L (ref 0.40–4.50)

## 2022-08-10 DIAGNOSIS — Z1211 Encounter for screening for malignant neoplasm of colon: Secondary | ICD-10-CM | POA: Diagnosis not present

## 2022-08-17 LAB — COLOGUARD: COLOGUARD: NEGATIVE

## 2022-11-16 ENCOUNTER — Other Ambulatory Visit: Payer: Self-pay | Admitting: Family Medicine

## 2022-11-16 DIAGNOSIS — Z1231 Encounter for screening mammogram for malignant neoplasm of breast: Secondary | ICD-10-CM

## 2022-12-06 ENCOUNTER — Ambulatory Visit (INDEPENDENT_AMBULATORY_CARE_PROVIDER_SITE_OTHER): Payer: BC Managed Care – PPO

## 2022-12-06 DIAGNOSIS — Z1231 Encounter for screening mammogram for malignant neoplasm of breast: Secondary | ICD-10-CM

## 2023-01-20 ENCOUNTER — Other Ambulatory Visit: Payer: Self-pay | Admitting: Family Medicine

## 2023-01-20 DIAGNOSIS — I10 Essential (primary) hypertension: Secondary | ICD-10-CM

## 2023-01-22 NOTE — Telephone Encounter (Signed)
Called patient, LVM to call office to schedule 6 month follow up, thanks.

## 2023-01-22 NOTE — Telephone Encounter (Signed)
Pls contact the patient to schedule 6-mth HTN with Dr. Ashley Royalty. Sending 30 day med refill. Thanks

## 2023-02-20 ENCOUNTER — Other Ambulatory Visit: Payer: Self-pay | Admitting: Family Medicine

## 2023-02-20 DIAGNOSIS — I10 Essential (primary) hypertension: Secondary | ICD-10-CM

## 2023-02-20 NOTE — Telephone Encounter (Signed)
Pls contact the patient to schedule HTN appt with Dr. Ashley Royalty for medication refill. Thanks

## 2023-02-20 NOTE — Telephone Encounter (Signed)
Called patient, LVM to call office to schedule appt, thanks.

## 2023-03-23 ENCOUNTER — Other Ambulatory Visit: Payer: Self-pay | Admitting: Family Medicine

## 2023-03-23 DIAGNOSIS — I10 Essential (primary) hypertension: Secondary | ICD-10-CM

## 2023-03-25 NOTE — Telephone Encounter (Signed)
Called patient left detailed voicemail that she would need to schedule an appointment for refills

## 2023-03-25 NOTE — Telephone Encounter (Signed)
Pls contact pt to schedule HTN appt. No additional refills without appt. Thx

## 2023-08-12 ENCOUNTER — Other Ambulatory Visit: Payer: Self-pay | Admitting: Family Medicine

## 2023-08-12 ENCOUNTER — Ambulatory Visit (INDEPENDENT_AMBULATORY_CARE_PROVIDER_SITE_OTHER): Admitting: Family Medicine

## 2023-08-12 ENCOUNTER — Encounter: Payer: Self-pay | Admitting: Family Medicine

## 2023-08-12 VITALS — BP 177/83 | HR 72 | Ht <= 58 in | Wt 134.0 lb

## 2023-08-12 DIAGNOSIS — I10 Essential (primary) hypertension: Secondary | ICD-10-CM | POA: Diagnosis not present

## 2023-08-12 DIAGNOSIS — Z0000 Encounter for general adult medical examination without abnormal findings: Secondary | ICD-10-CM

## 2023-08-12 DIAGNOSIS — E782 Mixed hyperlipidemia: Secondary | ICD-10-CM | POA: Diagnosis not present

## 2023-08-12 MED ORDER — LISINOPRIL 10 MG PO TABS
ORAL_TABLET | ORAL | 1 refills | Status: AC
Start: 2023-08-12 — End: ?

## 2023-08-12 NOTE — Progress Notes (Signed)
 Michele Burch - 63 y.o. female MRN [id number]  Date of birth: March 10, 1961  Subjective Chief Complaint  Patient presents with   Annual Exam    HPI Michele Burch is a 63 y.o. female here today for annual exam.   She reports that she is feeling well.  She is taking lisinopril  for HTN.  BP is elevated in clinic today.  Reports that readings at home have been well controlled.  She stopped crestor , reports that she felt dizzy while taking this.   She is somewhat active.  Feels that diet is OK most of the time.   She is a non-smoker.  Denies EtOH.   Review of Systems  Constitutional:  Negative for chills, fever, malaise/fatigue and weight loss.  HENT:  Negative for congestion, ear pain and sore throat.   Eyes:  Negative for blurred vision, double vision and pain.  Respiratory:  Negative for cough and shortness of breath.   Cardiovascular:  Negative for chest pain and palpitations.  Gastrointestinal:  Negative for abdominal pain, blood in stool, constipation, heartburn and nausea.  Genitourinary:  Negative for dysuria and urgency.  Musculoskeletal:  Negative for joint pain and myalgias.  Neurological:  Negative for dizziness and headaches.  Endo/Heme/Allergies:  Does not bruise/bleed easily.  Psychiatric/Behavioral:  Negative for depression. The patient is not nervous/anxious and does not have insomnia.     No Known Allergies  Past Medical History:  Diagnosis Date   HLD (hyperlipidemia) 04/30/2016   HTN (hypertension) 01/03/2015    Past Surgical History:  Procedure Laterality Date   APPENDECTOMY      Social History   Socioeconomic History   Marital status: Unknown    Spouse name: Not on file   Number of children: Not on file   Years of education: Not on file   Highest education level: Not on file  Occupational History   Not on file  Tobacco Use   Smoking status: Never   Smokeless tobacco: Never  Substance and Sexual Activity   Alcohol use: No    Alcohol/week: 0.0  standard drinks of alcohol   Drug use: No   Sexual activity: Never  Other Topics Concern   Not on file  Social History Narrative   Not on file   Social Drivers of Health   Financial Resource Strain: Low Risk  (08/12/2023)   Overall Financial Resource Strain (CARDIA)    Difficulty of Paying Living Expenses: Not very hard  Food Insecurity: No Food Insecurity (08/12/2023)   Hunger Vital Sign    Worried About Running Out of Food in the Last Year: Never true    Ran Out of Food in the Last Year: Never true  Transportation Needs: No Transportation Needs (08/12/2023)   PRAPARE - Administrator, Civil Service (Medical): No    Lack of Transportation (Non-Medical): No  Physical Activity: Not on file  Stress: No Stress Concern Present (08/12/2023)   [employer] of Occupational Health - Occupational Stress Questionnaire    Feeling of Stress : Only a little  Social Connections: Not on file    History reviewed. No pertinent family history.  Health Maintenance  Topic Date Due   Cervical Cancer Screening (HPV/Pap Cotest)  Never done   COVID-19 Vaccine (5 - 2024-25 season) 11/25/2022   INFLUENZA VACCINE  10/25/2023   MAMMOGRAM  12/05/2024   DTaP/Tdap/Td (2 - Td or Tdap) 01/02/2025   Fecal DNA (Cologuard)  08/09/2025   Hepatitis C Screening  Completed   HIV Screening  Completed   Zoster Vaccines- Shingrix   Completed   HPV VACCINES  Aged Out   Meningococcal B Vaccine  Aged Out     ----------------------------------------------------------------------------------------------------------------------------------------------------------------------------------------------------------------- Physical Exam BP (!) 177/83 (BP Location: Left Arm, Patient Position: Sitting, Cuff Size: Normal)   Pulse 72   Ht 4' 9.68" (1.465 m)   Wt 134 lb (60.8 kg)   SpO2 99%   BMI 28.32 kg/m   Physical Exam Constitutional:      General: She is not in acute distress. HENT:     Head:  Normocephalic and atraumatic.     Right Ear: Tympanic membrane and ear canal normal.     Left Ear: Tympanic membrane and ear canal normal.     Nose: Nose normal.  Eyes:     General: No scleral icterus.    Conjunctiva/sclera: Conjunctivae normal.  Neck:     Thyroid: No thyromegaly.  Cardiovascular:     Rate and Rhythm: Normal rate and regular rhythm.     Heart sounds: Normal heart sounds.  Pulmonary:     Effort: Pulmonary effort is normal.     Breath sounds: Normal breath sounds.  Abdominal:     General: Bowel sounds are normal. There is no distension.     Palpations: Abdomen is soft.     Tenderness: There is no abdominal tenderness. There is no guarding.  Musculoskeletal:        General: Normal range of motion.     Cervical back: Normal range of motion and neck supple.  Lymphadenopathy:     Cervical: No cervical adenopathy.  Skin:    General: Skin is warm and dry.     Findings: No rash.  Neurological:     General: No focal deficit present.     Mental Status: She is alert and oriented to person, place, and time.     Cranial Nerves: No cranial nerve deficit.     Coordination: Coordination normal.  Psychiatric:        Mood and Affect: Mood normal.        Behavior: Behavior normal.     ------------------------------------------------------------------------------------------------------------------------------------------------------------------------------------------------------------------- Assessment and Plan  Well adult exam Well adult Orders Placed This Encounter  Procedures   CMP14+EGFR   CBC with Differential/Platelet   Lipid Panel With LDL/HDL Ratio   TSH  Screenings: per lab orders. Declines cervical cancer screening at this time.  Immunizations: UTD Anticipatory guidance/Risk factor reduction:  Recommendations per AVS.    No orders of the defined types were placed in this encounter.   No follow-ups on file.

## 2023-08-12 NOTE — Patient Instructions (Signed)
 Preventive Care 16-64 Years Old, Female  Preventive care refers to lifestyle choices and visits with your health care provider that can promote health and wellness. Preventive care visits are also called wellness exams.  What can I expect for my preventive care visit?  Counseling  Your health care provider may ask you questions about your:  Medical history, including:  Past medical problems.  Family medical history.  Pregnancy history.  Current health, including:  Menstrual cycle.  Method of birth control.  Emotional well-being.  Home life and relationship well-being.  Sexual activity and sexual health.  Lifestyle, including:  Alcohol, nicotine or tobacco, and drug use.  Access to firearms.  Diet, exercise, and sleep habits.  Work and work Astronomer.  Sunscreen use.  Safety issues such as seatbelt and bike helmet use.  Physical exam  Your health care provider will check your:  Height and weight. These may be used to calculate your BMI (body mass index). BMI is a measurement that tells if you are at a healthy weight.  Waist circumference. This measures the distance around your waistline. This measurement also tells if you are at a healthy weight and may help predict your risk of certain diseases, such as type 2 diabetes and high blood pressure.  Heart rate and blood pressure.  Body temperature.  Skin for abnormal spots.  What immunizations do I need?    Vaccines are usually given at various ages, according to a schedule. Your health care provider will recommend vaccines for you based on your age, medical history, and lifestyle or other factors, such as travel or where you work.  What tests do I need?  Screening  Your health care provider may recommend screening tests for certain conditions. This may include:  Lipid and cholesterol levels.  Diabetes screening. This is done by checking your blood sugar (glucose) after you have not eaten for a while (fasting).  Pelvic exam and Pap test.  Hepatitis B test.  Hepatitis C  test.  HIV (human immunodeficiency virus) test.  STI (sexually transmitted infection) testing, if you are at risk.  Lung cancer screening.  Colorectal cancer screening.  Mammogram. Talk with your health care provider about when you should start having regular mammograms. This may depend on whether you have a family history of breast cancer.  BRCA-related cancer screening. This may be done if you have a family history of breast, ovarian, tubal, or peritoneal cancers.  Bone density scan. This is done to screen for osteoporosis.  Talk with your health care provider about your test results, treatment options, and if necessary, the need for more tests.  Follow these instructions at home:  Eating and drinking    Eat a diet that includes fresh fruits and vegetables, whole grains, lean protein, and low-fat dairy products.  Take vitamin and mineral supplements as recommended by your health care provider.  Do not drink alcohol if:  Your health care provider tells you not to drink.  You are pregnant, may be pregnant, or are planning to become pregnant.  If you drink alcohol:  Limit how much you have to 0-1 drink a day.  Know how much alcohol is in your drink. In the [place] one drink equals one 12 oz bottle of beer (355 mL), one 5 oz glass of wine (148 mL), or one 1 oz glass of hard liquor (44 mL).  Lifestyle  Brush your teeth every morning and night with fluoride toothpaste. Floss one time each day.  Exercise for at least  30 minutes 5 or more days each week.  Do not use any products that contain nicotine or tobacco. These products include cigarettes, chewing tobacco, and vaping devices, such as e-cigarettes. If you need help quitting, ask your health care provider.  Do not use drugs.  If you are sexually active, practice safe sex. Use a condom or other form of protection to prevent STIs.  If you do not wish to become pregnant, use a form of birth control. If you plan to become pregnant, see your health care provider for a  prepregnancy visit.  Take aspirin only as told by your health care provider. Make sure that you understand how much to take and what form to take. Work with your health care provider to find out whether it is safe and beneficial for you to take aspirin daily.  Find healthy ways to manage stress, such as:  Meditation, yoga, or listening to music.  Journaling.  Talking to a trusted person.  Spending time with friends and family.  Minimize exposure to UV radiation to reduce your risk of skin cancer.  Safety  Always wear your seat belt while driving or riding in a vehicle.  Do not drive:  If you have been drinking alcohol. Do not ride with someone who has been drinking.  When you are tired or distracted.  While texting.  If you have been using any mind-altering substances or drugs.  Wear a helmet and other protective equipment during sports activities.  If you have firearms in your house, make sure you follow all gun safety procedures.  Seek help if you have been physically or sexually abused.  What's next?  Visit your health care provider once a year for an annual wellness visit.  Ask your health care provider how often you should have your eyes and teeth checked.  Stay up to date on all vaccines.  This information is not intended to replace advice given to you by your health care provider. Make sure you discuss any questions you have with your health care provider.  Document Revised: 09/07/2020 Document Reviewed: 09/07/2020  Elsevier Patient Education  2024 [employer].

## 2023-08-12 NOTE — Addendum Note (Signed)
 Addended by: Renuka Farfan E on: 08/12/2023 08:30 AM   Modules accepted: Orders

## 2023-08-12 NOTE — Assessment & Plan Note (Addendum)
 Well adult Orders Placed This Encounter  Procedures   CMP14+EGFR   CBC with Differential/Platelet   Lipid Panel With LDL/HDL Ratio   TSH  Screenings: per lab orders. Declines cervical cancer screening at this time.  Immunizations: UTD Anticipatory guidance/Risk factor reduction:  Recommendations per AVS.

## 2023-08-13 LAB — CMP14+EGFR
ALT: 15 IU/L (ref 0–32)
AST: 18 IU/L (ref 0–40)
Albumin: 4.3 g/dL (ref 3.9–4.9)
Alkaline Phosphatase: 87 IU/L (ref 44–121)
BUN/Creatinine Ratio: 26 (ref 12–28)
BUN: 16 mg/dL (ref 8–27)
Bilirubin Total: 0.3 mg/dL (ref 0.0–1.2)
CO2: 22 mmol/L (ref 20–29)
Calcium: 9 mg/dL (ref 8.7–10.3)
Chloride: 105 mmol/L (ref 96–106)
Creatinine, Ser: 0.62 mg/dL (ref 0.57–1.00)
Globulin, Total: 2.4 g/dL (ref 1.5–4.5)
Glucose: 91 mg/dL (ref 70–99)
Potassium: 4.4 mmol/L (ref 3.5–5.2)
Sodium: 141 mmol/L (ref 134–144)
Total Protein: 6.7 g/dL (ref 6.0–8.5)
eGFR: 99 mL/min/{1.73_m2} (ref >59)

## 2023-08-13 LAB — CBC WITH DIFFERENTIAL/PLATELET
Basophils Absolute: 0.1 10*3/uL (ref 0.0–0.2)
Basos: 1 %
EOS (ABSOLUTE): 0.1 10*3/uL (ref 0.0–0.4)
Eos: 3 %
Hematocrit: 45.1 % (ref 34.0–46.6)
Hemoglobin: 14.3 g/dL (ref 11.1–15.9)
Immature Grans (Abs): 0 10*3/uL (ref 0.0–0.1)
Immature Granulocytes: 0 %
Lymphocytes Absolute: 1.7 10*3/uL (ref 0.7–3.1)
Lymphs: 39 %
MCH: 29.3 pg (ref 26.6–33.0)
MCHC: 31.7 g/dL (ref 31.5–35.7)
MCV: 92 fL (ref 79–97)
Monocytes Absolute: 0.3 10*3/uL (ref 0.1–0.9)
Monocytes: 8 %
Neutrophils Absolute: 2.2 10*3/uL (ref 1.4–7.0)
Neutrophils: 49 %
Platelets: 295 10*3/uL (ref 150–450)
RBC: 4.88 x10E6/uL (ref 3.77–5.28)
RDW: 13 % (ref 11.7–15.4)
WBC: 4.4 10*3/uL (ref 3.4–10.8)

## 2023-08-13 LAB — LIPID PANEL WITH LDL/HDL RATIO
Cholesterol, Total: 265 mg/dL — ABNORMAL HIGH (ref 100–199)
HDL: 59 mg/dL (ref >39)
LDL Chol Calc (NIH): 191 mg/dL — ABNORMAL HIGH (ref 0–99)
LDL/HDL Ratio: 3.2 ratio (ref 0.0–3.2)
Triglycerides: 89 mg/dL (ref 0–149)
VLDL Cholesterol Cal: 15 mg/dL (ref 5–40)

## 2023-08-13 LAB — TSH: TSH: 1.98 u[IU]/mL (ref 0.450–4.500)

## 2023-08-23 ENCOUNTER — Ambulatory Visit: Payer: Self-pay | Admitting: Family Medicine

## 2023-08-23 DIAGNOSIS — E782 Mixed hyperlipidemia: Secondary | ICD-10-CM

## 2023-08-26 MED ORDER — ATORVASTATIN CALCIUM 10 MG PO TABS: 10.0000 mg | ORAL_TABLET | Freq: Every day | ORAL | 3 refills | Status: AC

## 2023-08-26 NOTE — Telephone Encounter (Signed)
 Attempted call to patient via interpreter # [phone] [facility] Left a voice mail message that patient can return our call with an interpreter.

## 2023-08-26 NOTE — Telephone Encounter (Signed)
 Lipitor 10mg  daily sent to the pharmacy. She will need to have her lipids and CMP rechecked in 6-8 weeks after trying the medication.  ___________________________________________ Maryl Snook, DNP, APRN, FNP-BC Primary Care and Sports Medicine [facility]

## 2023-08-30 ENCOUNTER — Encounter: Payer: Self-pay | Admitting: Family Medicine

## 2023-08-30 NOTE — Telephone Encounter (Signed)
 Attempted call via interpreter # [phone] Michele Burch.  Left a voice mail message that patient could return our call via interpreter.  Also sent patient a letter in my chart and placed this letter in the mail.

## 2023-08-30 NOTE — Addendum Note (Signed)
 Addended by: Jamaal Bernasconi P on: 08/30/2023 10:17 AM   Modules accepted: Orders

## 2023-08-30 NOTE — Telephone Encounter (Signed)
 Orders for repeat CMP placed in patient chart.

## 2024-01-08 ENCOUNTER — Other Ambulatory Visit: Payer: Self-pay | Admitting: Family Medicine

## 2024-01-08 DIAGNOSIS — Z1231 Encounter for screening mammogram for malignant neoplasm of breast: Secondary | ICD-10-CM

## 2024-01-09 ENCOUNTER — Encounter

## 2024-01-09 DIAGNOSIS — Z1231 Encounter for screening mammogram for malignant neoplasm of breast: Secondary | ICD-10-CM

## 2024-01-16 ENCOUNTER — Encounter

## 2024-01-16 DIAGNOSIS — Z1231 Encounter for screening mammogram for malignant neoplasm of breast: Secondary | ICD-10-CM
